# Patient Record
Sex: Female | Born: 1975
Health system: Southern US, Community
[De-identification: ages and names within clinical notes are randomized; demographics above are authoritative.]

## PROBLEM LIST (undated history)

## (undated) DIAGNOSIS — E559 Vitamin D deficiency, unspecified: Secondary | ICD-10-CM

## (undated) DIAGNOSIS — D219 Benign neoplasm of connective and other soft tissue, unspecified: Secondary | ICD-10-CM

## (undated) DIAGNOSIS — R002 Palpitations: Secondary | ICD-10-CM

## (undated) DIAGNOSIS — R519 Headache, unspecified: Secondary | ICD-10-CM

## (undated) DIAGNOSIS — R0602 Shortness of breath: Secondary | ICD-10-CM

## (undated) DIAGNOSIS — M2550 Pain in unspecified joint: Secondary | ICD-10-CM

## (undated) DIAGNOSIS — R87619 Unspecified abnormal cytological findings in specimens from cervix uteri: Secondary | ICD-10-CM

## (undated) DIAGNOSIS — M7989 Other specified soft tissue disorders: Secondary | ICD-10-CM

## (undated) DIAGNOSIS — F900 Attention-deficit hyperactivity disorder, predominantly inattentive type: Secondary | ICD-10-CM

## (undated) DIAGNOSIS — E538 Deficiency of other specified B group vitamins: Secondary | ICD-10-CM

## (undated) DIAGNOSIS — K5900 Constipation, unspecified: Secondary | ICD-10-CM

## (undated) DIAGNOSIS — J45909 Unspecified asthma, uncomplicated: Secondary | ICD-10-CM

## (undated) DIAGNOSIS — N920 Excessive and frequent menstruation with regular cycle: Secondary | ICD-10-CM

## (undated) DIAGNOSIS — N946 Dysmenorrhea, unspecified: Secondary | ICD-10-CM

## (undated) DIAGNOSIS — M549 Dorsalgia, unspecified: Secondary | ICD-10-CM

## (undated) DIAGNOSIS — R12 Heartburn: Secondary | ICD-10-CM

## (undated) DIAGNOSIS — D573 Sickle-cell trait: Secondary | ICD-10-CM

## (undated) HISTORY — DX: Deficiency of other specified B group vitamins: E53.8

## (undated) HISTORY — DX: Headache, unspecified: R51.9

## (undated) HISTORY — DX: Benign neoplasm of connective and other soft tissue, unspecified: D21.9

## (undated) HISTORY — DX: Palpitations: R00.2

## (undated) HISTORY — DX: Shortness of breath: R06.02

## (undated) HISTORY — DX: Excessive and frequent menstruation with regular cycle: N92.0

## (undated) HISTORY — DX: Vitamin D deficiency, unspecified: E55.9

## (undated) HISTORY — DX: Pain in unspecified joint: M25.50

## (undated) HISTORY — DX: Attention-deficit hyperactivity disorder, predominantly inattentive type: F90.0

## (undated) HISTORY — DX: Unspecified abnormal cytological findings in specimens from cervix uteri: R87.619

## (undated) HISTORY — DX: Other specified soft tissue disorders: M79.89

## (undated) HISTORY — DX: Dorsalgia, unspecified: M54.9

## (undated) HISTORY — PX: ABDOMINAL HYSTERECTOMY: SHX81

## (undated) HISTORY — DX: Unspecified asthma, uncomplicated: J45.909

## (undated) HISTORY — DX: Dysmenorrhea, unspecified: N94.6

## (undated) HISTORY — DX: Constipation, unspecified: K59.00

## (undated) HISTORY — DX: Sickle-cell trait: D57.3

## (undated) HISTORY — DX: Heartburn: R12

---

## 2005-11-30 HISTORY — PX: ABDOMINAL HYSTERECTOMY: SHX81

## 2017-11-13 ENCOUNTER — Emergency Department (HOSPITAL_COMMUNITY)
Admission: EM | Admit: 2017-11-13 | Discharge: 2017-11-13 | Disposition: A | Discharge status: Home / Self Care | Payer: 59 | Attending: Emergency Medicine | Admitting: Emergency Medicine

## 2017-11-13 ENCOUNTER — Emergency Department (HOSPITAL_COMMUNITY): Payer: 59

## 2017-11-13 ENCOUNTER — Encounter (HOSPITAL_COMMUNITY): Payer: Self-pay

## 2017-11-13 DIAGNOSIS — M25511 Pain in right shoulder: Secondary | ICD-10-CM | POA: Diagnosis not present

## 2017-11-13 NOTE — ED Triage Notes (Signed)
Pt complains of right arm pain since after Thanksgiving and now the pain is in her shoulder, no injury noted Pt states that she doesn't have full extension of that arm

## 2017-11-13 NOTE — Discharge Instructions (Signed)
1. Medications: Alternate 600 mg of ibuprofen and [phone] mg of Tylenol every 3 hours as needed for pain for the next 3-5 days. Do not exceed 4000 mg of Tylenol daily. 2. Treatment: rest, ice, elevate at rest, drink plenty of fluids, gentle stretching (see attached exercises) 3. Follow Up: Please followup with orthopedics as directed in 1 week if no improvement for discussion of your diagnoses and further evaluation after today's visit; Please return to the ER for worsening symptoms or other concerns such as fever, weakness, shortness of breath, or chest pain

## 2017-11-13 NOTE — ED Provider Notes (Signed)
[facility] DEPT Provider Note   CSN: [id number] Arrival date & time: 11/13/17  1920     History   Chief Complaint No chief complaint on file.   HPI Norma Soto is a 41 y.o. female with no significant past medical history who presents today with chief complaint acute onset, progressively worsening right upper arm and shoulder pain for 3 weeks.  Patient states that she began developing a constant achy pain to the anterior right upper arm, primarily in the deltoid region which has since radiated to the right shoulder.  She also endorses recent development of intermittent tingling of the digits of the right hand.  Pain worsens with certain movements, primarily upward motion and abduction of the right shoulder.  She denies weakness.  Denies fevers, chills, chest pain, neck pain, shortness of breath.  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  She has used a TENS unit as well as without significant relief over-the-counter Tylenol arthritis with mild relief.  The history is provided by the patient.    History reviewed. No pertinent past medical history.  There are no active problems to display for this patient.   History reviewed. No pertinent surgical history.  OB History    No data available       Home Medications    Prior to Admission medications   Not on File    Family History History reviewed. No pertinent family history.  Social History Social History   Tobacco Use  . Smoking status: Never Smoker  . Smokeless tobacco: Never Used  Substance Use Topics  . Alcohol use: No    Frequency: Never  . Drug use: No     Allergies   Patient has no allergy information on record.   Review of Systems Review of Systems  Constitutional: Negative for chills and fever.  Respiratory: Negative for shortness of breath.   Cardiovascular: Negative for chest pain.    Musculoskeletal: Positive for arthralgias (Right shoulder). Negative for neck pain.  Neurological: Negative for weakness and numbness.     Physical Exam Updated Vital Signs BP (!) 129/91 (BP Location: Left Arm)   Pulse 80   Temp 98.5 F (36.9 C) (Oral)   Resp 18   SpO2 99%   Physical Exam  Constitutional: She appears well-developed and well-nourished. No distress.  HENT:  Head: Normocephalic and atraumatic.  Eyes: Conjunctivae are normal. Right eye exhibits no discharge. Left eye exhibits no discharge.  Neck: Normal range of motion. Neck supple. No JVD present. No tracheal deviation present.  No midline spine TTP, right paracervical muscle spasm noted without significant tenderness, no deformity, crepitus, or step-off noted   Cardiovascular: Normal rate and intact distal pulses.  2+ radial pulses bilaterally  Pulmonary/Chest: Effort normal.  Abdominal: She exhibits no distension.  Musculoskeletal: She exhibits tenderness. She exhibits no edema.  Slightly decreased range of motion with abduction and upward motion of the right shoulder secondary to pain .  Positive arc of pain.  She is tender to palpation overlying the right acromioclavicular joint.  No tenderness to palpation of the bicipital tendon groove.  Positive empty can sign on the right, positive Hawkins sign on the right.  5/5 strength of BUE major muscle groups.  No deformity, crepitus, erythema, or warmth noted.  Neurological: She is alert.  Fluent speech, no facial droop, sensation intact to soft touch of bilateral upper extremities, good grip  strength  Skin: Skin is warm and dry. Capillary refill takes less than 2 seconds. No erythema.  Psychiatric: She has a normal mood and affect. Her behavior is normal.  Nursing note and vitals reviewed.    ED Treatments / Results  Labs (all labs ordered are listed, but only abnormal results are displayed) Labs Reviewed - No data to display  EKG  EKG Interpretation None        Radiology Dg Shoulder Right  Result Date: 11/13/2017 CLINICAL DATA:  Right shoulder pain EXAM: RIGHT SHOULDER - 2+ VIEW COMPARISON:  None. FINDINGS: There is no evidence of fracture or dislocation. There is no evidence of arthropathy or other focal bone abnormality. Soft tissues are unremarkable. IMPRESSION: Negative. Electronically Signed   By: Kerby Moors M.D.   On: 11/13/2017 19:56    Procedures Procedures (including critical care time)  Medications Ordered in ED Medications - No data to display   Initial Impression / Assessment and Plan / ED Course  I have reviewed the triage vital signs and the nursing notes.  Pertinent labs & imaging results that were available during my care of the patient were reviewed by me and considered in my medical decision making (see chart for details).     Patient with right shoulder pain for 3 weeks.  History and physical examination consistent with rotator cuff injury or inflammation.  Afebrile, vital signs are stable.  No focal neurological deficits.  Does not appear to be a cervical radiculopathy.  Low suspicion of septic joint, gout, osteomyelitis, fracture, or dislocation.  Radiographs reviewed by me show no acute bony abnormality.  RICE therapy indicated and discussed with patient.  Discussed the importance of ibuprofen, Tylenol, ice, and heat.  Will discharge with range of motion exercises.  She will follow-up with orthopedics for reevaluation of her symptoms.  Discussed indications for return to the ED. Pt verbalized understanding of and agreement with plan and is safe for discharge home at this time.  She has no complaints prior to discharge.  Final Clinical Impressions(s) / ED Diagnoses   Final diagnoses:  Acute pain of right shoulder    ED Discharge Orders    None       Debroah Baller 11/13/17 2020    Charlesetta Shanks, MD 11/15/17 [phone]

## 2018-01-04 ENCOUNTER — Emergency Department (HOSPITAL_COMMUNITY)
Admission: EM | Admit: 2018-01-04 | Discharge: 2018-01-04 | Disposition: A | Discharge status: Home / Self Care | Payer: BLUE CROSS/BLUE SHIELD | Attending: Emergency Medicine | Admitting: Emergency Medicine

## 2018-01-04 ENCOUNTER — Encounter (HOSPITAL_COMMUNITY): Payer: Self-pay | Admitting: Emergency Medicine

## 2018-01-04 DIAGNOSIS — B9789 Other viral agents as the cause of diseases classified elsewhere: Secondary | ICD-10-CM

## 2018-01-04 DIAGNOSIS — J069 Acute upper respiratory infection, unspecified: Secondary | ICD-10-CM

## 2018-01-04 DIAGNOSIS — R05 Cough: Secondary | ICD-10-CM | POA: Diagnosis not present

## 2018-01-04 MED ORDER — HYDROCODONE-HOMATROPINE 5-1.5 MG/5ML PO SYRP: 5.0000 mL | ORAL_SOLUTION | Freq: Four times a day (QID) | ORAL | 0 refills | Status: DC | PRN

## 2018-01-04 MED ORDER — ACETAMINOPHEN 325 MG PO TABS
650.0000 mg | ORAL_TABLET | Freq: Once | ORAL | Status: AC | PRN
  Administered 2018-01-04: 650 mg via ORAL
  Filled 2018-01-04: qty 2

## 2018-01-04 NOTE — Discharge Instructions (Signed)
It was my pleasure taking care of you today!   Your symptoms are likely due to a viral upper respiratory infection. Fortunately, we did not see evidence of serious infection and can treat your symptoms. Cough syrup at night - This can make you very drowsy - please do not drink alcohol, operate heavy machinery or drive on this medication. Alternate between Tylenol and ibuprofen as needed for body aches / fevers.   Rest, drink plenty of fluids to be sure you are staying hydrated.   Please follow up with your primary doctor for discussion of your diagnoses and further evaluation after today's visit if symptoms persist longer than 7 days; Return to the ER for high fevers, difficulty breathing or other concerning symptoms

## 2018-01-04 NOTE — ED Provider Notes (Signed)
[facility] DEPT Provider Note   CSN: [id number] Arrival date & time: 01/04/18  [place]     History   Chief Complaint Chief Complaint  Patient presents with  . Generalized Body Aches    HPI Norma Soto is a 42 y.o. female.  The history is provided by the patient and medical records. No language interpreter was used.   Norma Soto is an otherwise healthy 42 y.o. female who presents to the Emergency Department complaining of persistent dry cough which began yesterday.  Associated symptoms include rhinorrhea.  She has been taking Delsym with no improvement.  Denies fever, chills, shortness of breath, chest pain, abdominal pain, nausea, vomiting, diarrhea, sore throat.  Not a smoker.  No history of lung disease.    History reviewed. No pertinent past medical history.  There are no active problems to display for this patient.   Past Surgical History:  Procedure Laterality Date  . ABDOMINAL HYSTERECTOMY      OB History    No data available       Home Medications    Prior to Admission medications   Medication Sig Start Date End Date Taking? Authorizing Provider  HYDROcodone-homatropine (HYCODAN) 5-1.5 MG/5ML syrup Take 5 mLs by mouth every 6 (six) hours as needed for cough. 01/04/18   Nicolasa Milbrath, Ozella Almond, PA-C    Family History No family history on file.  Social History Social History   Tobacco Use  . Smoking status: Never Smoker  . Smokeless tobacco: Never Used  Substance Use Topics  . Alcohol use: No    Frequency: Never  . Drug use: No     Allergies   Patient has no allergy information on record.   Review of Systems Review of Systems  Constitutional: Negative for chills and fever.  HENT: Positive for rhinorrhea. Negative for ear pain, sinus pain and sore throat.   Respiratory: Positive for cough. Negative for shortness of breath and wheezing.   Cardiovascular: Negative for chest pain.     Physical Exam Updated  Vital Signs BP (!) 140/93 (BP Location: Left Arm)   Pulse 89   Temp 99.4 F (37.4 C) (Oral)   Resp 16   SpO2 98%   Physical Exam  Constitutional: She appears well-developed and well-nourished. No distress.  Well-appearing.  HENT:  Head: Normocephalic and atraumatic.  Oropharynx with scant erythema.  No exudates or tonsillar hypertrophy.  No focal areas of sinus tenderness.  Neck: Neck supple.  Cardiovascular: Normal rate, regular rhythm and normal heart sounds.  No murmur heard. Pulmonary/Chest: Effort normal and breath sounds normal. No respiratory distress.  Lungs clear to auscultation bilaterally.  Musculoskeletal: Normal range of motion.  Neurological: She is alert.  Skin: Skin is warm and dry.  Nursing note and vitals reviewed.    ED Treatments / Results  Labs (all labs ordered are listed, but only abnormal results are displayed) Labs Reviewed - No data to display  EKG  EKG Interpretation None       Radiology No results found.  Procedures Procedures (including critical care time)  Medications Ordered in ED Medications  acetaminophen (TYLENOL) tablet 650 mg (650 mg Oral Given 01/04/18 1907)     Initial Impression / Assessment and Plan / ED Course  I have reviewed the triage vital signs and the nursing notes.  Pertinent labs & imaging results that were available during my care of the patient were reviewed by me and considered in my medical decision making (see chart for details).  Norma Soto is a 42 y.o. female who presents to ED for dry cough. Persistent dry coughing fits throughout examination. She is afebrile, non-toxic appearing with a clear lung exam. Mild rhinorrhea and OP with erythema but no exudates or tonsillar hypertrophy.  Sxs today likely due to viral URI.Symptomatic home care instructions discussed. PCP follow up strongly encouraged if symptoms persist. Reasons to return to ER discussed. All questions answered.   Blood pressure (!)  140/93, pulse 89, temperature 99.4 F (37.4 C), temperature source Oral, resp. rate 16, SpO2 98 %.   Final Clinical Impressions(s) / ED Diagnoses   Final diagnoses:  Viral URI with cough    ED Discharge Orders        Ordered    HYDROcodone-homatropine (HYCODAN) 5-1.5 MG/5ML syrup  Every 6 hours PRN     01/04/18 2058       Askia Hazelip, Ozella Almond, PA-C 01/04/18 2104    Orlie Dakin, MD 01/05/18 4037

## 2018-01-04 NOTE — ED Triage Notes (Signed)
Patient reports nasal congestion starting 3 days ago now having sore throat, headache and generalized body aches. Non productive dry cough. Speaking in full sentences in NAD.

## 2018-01-06 DIAGNOSIS — R05 Cough: Secondary | ICD-10-CM | POA: Diagnosis not present

## 2018-01-06 DIAGNOSIS — R0982 Postnasal drip: Secondary | ICD-10-CM | POA: Diagnosis not present

## 2018-01-06 DIAGNOSIS — R0989 Other specified symptoms and signs involving the circulatory and respiratory systems: Secondary | ICD-10-CM | POA: Diagnosis not present

## 2018-01-06 DIAGNOSIS — J111 Influenza due to unidentified influenza virus with other respiratory manifestations: Secondary | ICD-10-CM | POA: Diagnosis not present

## 2018-03-25 ENCOUNTER — Encounter: Payer: Self-pay | Admitting: Certified Nurse Midwife

## 2018-03-25 ENCOUNTER — Ambulatory Visit (INDEPENDENT_AMBULATORY_CARE_PROVIDER_SITE_OTHER): Payer: BLUE CROSS/BLUE SHIELD | Admitting: Certified Nurse Midwife

## 2018-03-25 ENCOUNTER — Other Ambulatory Visit: Payer: Self-pay

## 2018-03-25 VITALS — BP 120/80 | HR 70 | Resp 16 | Ht 61.75 in | Wt 185.0 lb

## 2018-03-25 DIAGNOSIS — Z113 Encounter for screening for infections with a predominantly sexual mode of transmission: Secondary | ICD-10-CM | POA: Diagnosis not present

## 2018-03-25 DIAGNOSIS — Z01419 Encounter for gynecological examination (general) (routine) without abnormal findings: Secondary | ICD-10-CM | POA: Diagnosis not present

## 2018-03-25 DIAGNOSIS — Z0000 Encounter for general adult medical examination without abnormal findings: Secondary | ICD-10-CM

## 2018-03-25 NOTE — Progress Notes (Signed)
42 y.o. G1L1 Single  African American Fe here to establish gyn care and  for annual exam. History of Hysterectomy for fibroid and bleeding. One abnormal pap smear with just repeat and negative. Sees [facility] now after moving to area  for flu 2/19 and treated.  Has new partner and desires  STD screening with screening labs if possible.working on eating better with good food choices now for weight loss. No other health issues today.  No LMP recorded. Patient has had a hysterectomy.          Sexually active: Yes.    The current method of family planning is status post hysterectomy.    Exercising: Yes.    cardio Smoker:  no  Health Maintenance: Pap:  10/2015 neg per patient History of Abnormal Pap: yrs ago, just repeat done MMG:  none Self Breast exams: occ Colonoscopy:  none BMD:   none TDaP:  More than 10 years Shingles: no Pneumonia: no Hep C and HIV: both neg per patient Labs: if needed.   reports that she has never smoked. She has never used smokeless tobacco. She reports that she does not drink alcohol or use drugs.  Past Medical History:  Diagnosis Date  . Abnormal Pap smear of cervix    many yrs ago, just repeat done  . Dysmenorrhea   . Fibroid   . Menorrhagia     Past Surgical History:  Procedure Laterality Date  . ABDOMINAL HYSTERECTOMY    . CESAREAN SECTION      No current outpatient medications on file.   No current facility-administered medications for this visit.     Family History  Problem Relation Age of Onset  . Multiple myeloma Mother   . Hypertension Father   . Stroke Father   . Heart attack Father   . Heart disease Father     ROS:  Pertinent items are noted in HPI.  Otherwise, a comprehensive ROS was negative.  Exam:   BP 120/80   Pulse 70   Resp 16   Ht 5' 1.75" (1.568 m)   Wt 185 lb (83.9 kg)   BMI 34.11 kg/m  Height: 5' 1.75" (156.8 cm) Ht Readings from Last 3 Encounters:  03/25/18 5' 1.75" (1.568 m)    General  appearance: alert, cooperative and appears stated age Head: Normocephalic, without obvious abnormality, atraumatic Neck: no adenopathy, supple, symmetrical, trachea midline and thyroid normal to inspection and palpation Lungs: clear to auscultation bilaterally Breasts: normal appearance, no masses or tenderness, No nipple retraction or dimpling, No nipple discharge or bleeding, No axillary or supraclavicular adenopathy, pendulous bilateral breasts Heart: regular rate and rhythm Abdomen: soft, non-tender; no masses,  no organomegaly Extremities: extremities normal, atraumatic, no cyanosis or edema Skin: Skin color, texture, turgor normal. No rashes or lesions Lymph nodes: Cervical, supraclavicular, and axillary nodes normal. No abnormal inguinal nodes palpated Neurologic: Grossly normal   Pelvic: External genitalia:  no lesions              Urethra:  normal appearing urethra with no masses, tenderness or lesions              Bartholin's and Skene's: normal                 Vagina: normal appearing vagina with normal color and discharge, no lesions              Cervix: absent  Pap taken: No. Bimanual Exam:  Uterus:  uterus absent              Adnexa: normal adnexa and no mass, fullness, tenderness               Rectovaginal: Confirms               Anus:  normal sphincter tone, no lesions  Chaperone present: yes  A:  Well Woman with normal exam  S/P TAH with ovaries retained for fibroid uterus and menorrhagia  Obesity on weight loss with change in diet  STD screening and screening labs requested  Mammogram screening due  P:   Reviewed health and wellness pertinent to exam  Discussed weight loss and exercise for improved health and prevention of other health concerns. Work on portion control and fresh colorful food with lean protein.  Discussed STD prevention.  Labs:STD panel, Hep C, Gc/Chlamydia, vaginal screen TSH, Lipid panel, Vitamin D, CBC  Given mammogram screening  information and recommended starting screening this year. Questions addressed.  Pap smear: no  counseled on breast self exam,  mammography screening, STD prevention, HIV risk factors and prevention, adequate intake of calcium and vitamin D, diet and exercise  return annually or prn  An After Visit Summary was printed and given to the patient.

## 2018-03-25 NOTE — Patient Instructions (Signed)

## 2018-03-26 LAB — HEPATITIS C ANTIBODY: Hep C Virus Ab: <0.1 s/co ratio (ref 0.0–0.9)

## 2018-03-26 LAB — VAGINITIS/VAGINOSIS, DNA PROBE
Candida Species: NEGATIVE
Gardnerella vaginalis: POSITIVE — AB
Trichomonas vaginosis: NEGATIVE

## 2018-03-26 LAB — LIPID PANEL
Chol/HDL Ratio: 3.4 ratio (ref 0.0–4.4)
Cholesterol, Total: 175 mg/dL (ref 100–199)
HDL: 52 mg/dL (ref >39)
LDL Calculated: 115 mg/dL — ABNORMAL HIGH (ref 0–99)
Triglycerides: 38 mg/dL (ref 0–149)
VLDL Cholesterol Cal: 8 mg/dL (ref 5–40)

## 2018-03-26 LAB — CBC
Hematocrit: 37.9 % (ref 34.0–46.6)
Hemoglobin: 12.3 g/dL (ref 11.1–15.9)
MCH: 27.5 pg (ref 26.6–33.0)
MCHC: 32.5 g/dL (ref 31.5–35.7)
MCV: 85 fL (ref 79–97)
Platelets: 196 10*3/uL (ref 150–379)
RBC: 4.47 x10E6/uL (ref 3.77–5.28)
RDW: 13.6 % (ref 12.3–15.4)
WBC: 3.6 10*3/uL (ref 3.4–10.8)

## 2018-03-26 LAB — VITAMIN D 25 HYDROXY (VIT D DEFICIENCY, FRACTURES): Vit D, 25-Hydroxy: 26.8 ng/mL — ABNORMAL LOW (ref 30.0–100.0)

## 2018-03-26 LAB — HEP, RPR, HIV PANEL
HIV Screen 4th Generation wRfx: NONREACTIVE
Hepatitis B Surface Ag: NEGATIVE
RPR Ser Ql: NONREACTIVE

## 2018-03-26 LAB — TSH: TSH: 1.17 u[IU]/mL (ref 0.450–4.500)

## 2018-03-26 LAB — GC/CHLAMYDIA PROBE AMP
Chlamydia trachomatis, NAA: NEGATIVE
Neisseria gonorrhoeae by PCR: NEGATIVE

## 2018-03-28 ENCOUNTER — Other Ambulatory Visit: Payer: Self-pay | Admitting: Certified Nurse Midwife

## 2018-03-28 DIAGNOSIS — E559 Vitamin D deficiency, unspecified: Secondary | ICD-10-CM

## 2018-03-29 ENCOUNTER — Other Ambulatory Visit: Payer: Self-pay

## 2018-03-29 MED ORDER — METRONIDAZOLE 500 MG PO TABS: 500.0000 mg | ORAL_TABLET | Freq: Two times a day (BID) | ORAL | 0 refills | Status: DC

## 2018-06-24 ENCOUNTER — Other Ambulatory Visit (INDEPENDENT_AMBULATORY_CARE_PROVIDER_SITE_OTHER): Payer: BLUE CROSS/BLUE SHIELD

## 2018-06-24 DIAGNOSIS — E559 Vitamin D deficiency, unspecified: Secondary | ICD-10-CM

## 2018-06-25 LAB — VITAMIN D 25 HYDROXY (VIT D DEFICIENCY, FRACTURES): Vit D, 25-Hydroxy: 48.4 ng/mL (ref 30.0–100.0)

## 2018-07-22 ENCOUNTER — Encounter: Payer: Self-pay | Admitting: Certified Nurse Midwife

## 2018-07-22 ENCOUNTER — Other Ambulatory Visit: Payer: Self-pay

## 2018-07-22 ENCOUNTER — Ambulatory Visit: Payer: BLUE CROSS/BLUE SHIELD | Admitting: Certified Nurse Midwife

## 2018-07-22 VITALS — BP 120/70 | HR 70 | Resp 16 | Ht 61.75 in | Wt 188.0 lb

## 2018-07-22 DIAGNOSIS — Z113 Encounter for screening for infections with a predominantly sexual mode of transmission: Secondary | ICD-10-CM

## 2018-07-22 DIAGNOSIS — Z01419 Encounter for gynecological examination (general) (routine) without abnormal findings: Secondary | ICD-10-CM

## 2018-07-22 NOTE — Patient Instructions (Signed)
Sexually Transmitted Disease  A sexually transmitted disease (STD) is a disease or infection that may be passed (transmitted) from person to person, usually during sexual activity. This may happen by way of saliva, semen, blood, vaginal mucus, or urine. Common STDs include:   Gonorrhea.   Chlamydia.   Syphilis.   HIV and AIDS.   Genital herpes.   Hepatitis B and C.   Trichomonas.   Human papillomavirus (HPV).   Pubic lice.   Scabies.   Mites.   Bacterial vaginosis.    What are the causes?  An STD may be caused by bacteria, a virus, or parasites. STDs are often transmitted during sexual activity if one person is infected. However, they may also be transmitted through nonsexual means. STDs may be transmitted after:   Sexual intercourse with an infected person.   Sharing sex toys with an infected person.   Sharing needles with an infected person or using unclean piercing or tattoo needles.   Having intimate contact with the genitals, mouth, or rectal areas of an infected person.   Exposure to infected fluids during birth.    What are the signs or symptoms?  Different STDs have different symptoms. Some people may not have any symptoms. If symptoms are present, they may include:   Painful or bloody urination.   Pain in the pelvis, abdomen, vagina, anus, throat, or eyes.   A skin rash, itching, or irritation.   Growths, ulcerations, blisters, or sores in the genital and anal areas.   Abnormal vaginal discharge with or without bad odor.   Penile discharge in men.   Fever.   Pain or bleeding during sexual intercourse.   Swollen glands in the groin area.   Yellow skin and eyes (jaundice). This is seen with hepatitis.   Swollen testicles.   Infertility.   Sores and blisters in the mouth.    How is this diagnosed?  To make a diagnosis, your health care provider may:   Take a medical history.   Perform a physical exam.   Take a sample of any discharge to examine.   Swab the throat, cervix,  opening to the penis, rectum, or vagina for testing.   Test a sample of your first morning urine.   Perform blood tests.   Perform a Pap test, if this applies.   Perform a colposcopy.   Perform a laparoscopy.    How is this treated?  Treatment depends on the STD. Some STDs may be treated but not cured.   Chlamydia, gonorrhea, trichomonas, and syphilis can be cured with antibiotic medicine.   Genital herpes, hepatitis, and HIV can be treated, but not cured, with prescribed medicines. The medicines lessen symptoms.   Genital warts from HPV can be treated with medicine or by freezing, burning (electrocautery), or surgery. Warts may come back.   HPV cannot be cured with medicine or surgery. However, abnormal areas may be removed from the cervix, vagina, or vulva.   If your diagnosis is confirmed, your recent sexual partners need treatment. This is true even if they are symptom-free or have a negative culture or evaluation. They should not have sex until their health care providers say it is okay.   Your health care provider may test you for infection again 3 months after treatment.    How is this prevented?  Take these steps to reduce your risk of getting an STD:   Use latex condoms, dental dams, and water-soluble lubricants during sexual activity. Do not use   petroleum jelly or oils.   Avoid having multiple sex partners.   Do not have sex with someone who has other sex partners.   Do not have sex with anyone you do not know or who is at high risk for an STD.   Avoid risky sex practices that can break your skin.   Do not have sex if you have open sores on your mouth or skin.   Avoid drinking too much alcohol or taking illegal drugs. Alcohol and drugs can affect your judgment and put you in a vulnerable position.   Avoid engaging in oral and anal sex acts.   Get vaccinated for HPV and hepatitis. If you have not received these vaccines in the past, talk to your health care provider about whether one or  both might be right for you.   If you are at risk of being infected with HIV, it is recommended that you take a prescription medicine daily to prevent HIV infection. This is called pre-exposure prophylaxis (PrEP). You are considered at risk if:  ? You are a man who has sex with other men (MSM).  ? You are a heterosexual man or woman and are sexually active with more than one partner.  ? You take drugs by injection.  ? You are sexually active with a partner who has HIV.   Talk with your health care provider about whether you are at high risk of being infected with HIV. If you choose to begin PrEP, you should first be tested for HIV. You should then be tested every 3 months for as long as you are taking PrEP.    Contact a health care provider if:   See your health care provider.   Tell your sexual partner(s). They should be tested and treated for any STDs.   Do not have sex until your health care provider says it is okay.  Get help right away if:  Contact your health care provider right away if:   You have severe abdominal pain.   You are a man and notice swelling or pain in your testicles.   You are a woman and notice swelling or pain in your vagina.    This information is not intended to replace advice given to you by your health care provider. Make sure you discuss any questions you have with your health care provider.  Document Released: 02/06/2003 Document Revised: 06/05/2016 Document Reviewed: 06/06/2013  Elsevier Interactive Patient Education  2018 [employer]

## 2018-07-22 NOTE — Progress Notes (Signed)
  Subjective:     Patient ID: Norma Soto, female   DOB: 12-03-1975, 42 y.o.   MRN: [id number]  42 yo g1p1 single female here for STD screening due to partner had a partner change. No vaginal issues or change in discharge or odor or pelvic pain. Periods none due to Hysterectomy. No other health issues today.    Review of Systems  Constitutional: Negative.   HENT: Negative.   Eyes: Negative.   Respiratory: Negative.   Cardiovascular: Negative.   Gastrointestinal: Negative.   Endocrine: Negative.   Genitourinary:       Vaginal itching  Musculoskeletal: Negative.   Skin: Negative.   Allergic/Immunologic: Negative.   Neurological: Negative.   Hematological: Negative.   Psychiatric/Behavioral: Negative.        Objective:   Physical Exam  Constitutional: She is oriented to person, place, and time. She appears well-developed and well-nourished.  Abdominal: Soft.  Genitourinary: Rectum normal. Pelvic exam was performed with patient supine. There is no rash, tenderness, lesion or injury on the right labia. There is no rash, tenderness, lesion or injury on the left labia. Right adnexum displays no mass, no tenderness and no fullness. Left adnexum displays no mass, no tenderness and no fullness. Vaginal discharge found.  Genitourinary Comments: Normal appearance Uterus and cervix surgically absent  Lymphadenopathy: No inguinal adenopathy noted on the right or left side.  Neurological: She is oriented to person, place, and time.  Skin: Skin is dry.  Psychiatric: She has a normal mood and affect. Her behavior is normal. Judgment and thought content normal.       Assessment:     Normal pelvic exam STD screening due to partner change    Plan:     Discussed normal pelvic exam finding. Labs: Affirm, GC/chlamydia, STD panel, Hep C Stressed condom use for protection  Rv prn

## 2018-07-23 LAB — VAGINITIS/VAGINOSIS, DNA PROBE
Candida Species: NEGATIVE
Gardnerella vaginalis: POSITIVE — AB
Trichomonas vaginosis: NEGATIVE

## 2018-07-23 LAB — HEPATITIS C ANTIBODY: Hep C Virus Ab: <0.1 s/co ratio (ref 0.0–0.9)

## 2018-07-23 LAB — HEP, RPR, HIV PANEL
HIV Screen 4th Generation wRfx: NONREACTIVE
Hepatitis B Surface Ag: NEGATIVE
RPR Ser Ql: NONREACTIVE

## 2018-07-24 ENCOUNTER — Other Ambulatory Visit: Payer: Self-pay | Admitting: Certified Nurse Midwife

## 2018-07-24 DIAGNOSIS — N760 Acute vaginitis: Principal | ICD-10-CM

## 2018-07-24 DIAGNOSIS — B9689 Other specified bacterial agents as the cause of diseases classified elsewhere: Secondary | ICD-10-CM

## 2018-07-24 MED ORDER — METRONIDAZOLE 0.75 % VA GEL: VAGINAL | 0 refills | Status: DC

## 2018-07-27 LAB — GC/CHLAMYDIA PROBE AMP
Chlamydia trachomatis, NAA: NEGATIVE
Neisseria gonorrhoeae by PCR: NEGATIVE

## 2018-09-14 ENCOUNTER — Other Ambulatory Visit: Payer: Self-pay | Admitting: Certified Nurse Midwife

## 2018-09-14 DIAGNOSIS — Z1231 Encounter for screening mammogram for malignant neoplasm of breast: Secondary | ICD-10-CM

## 2018-10-21 ENCOUNTER — Ambulatory Visit
Admission: RE | Admit: 2018-10-21 | Discharge: 2018-10-21 | Disposition: A | Discharge status: Home / Self Care | Payer: BLUE CROSS/BLUE SHIELD | Source: Ambulatory Visit | Attending: Certified Nurse Midwife | Admitting: Certified Nurse Midwife

## 2018-10-21 DIAGNOSIS — Z1231 Encounter for screening mammogram for malignant neoplasm of breast: Secondary | ICD-10-CM | POA: Diagnosis not present

## 2018-10-25 ENCOUNTER — Other Ambulatory Visit: Payer: Self-pay | Admitting: Certified Nurse Midwife

## 2018-10-25 DIAGNOSIS — R928 Other abnormal and inconclusive findings on diagnostic imaging of breast: Secondary | ICD-10-CM

## 2018-10-30 HISTORY — PX: BREAST BIOPSY: SHX20

## 2018-11-01 ENCOUNTER — Ambulatory Visit
Admission: RE | Admit: 2018-11-01 | Discharge: 2018-11-01 | Disposition: A | Discharge status: Home / Self Care | Payer: BLUE CROSS/BLUE SHIELD | Source: Ambulatory Visit | Attending: Certified Nurse Midwife | Admitting: Certified Nurse Midwife

## 2018-11-01 ENCOUNTER — Other Ambulatory Visit: Payer: Self-pay | Admitting: Certified Nurse Midwife

## 2018-11-01 DIAGNOSIS — R928 Other abnormal and inconclusive findings on diagnostic imaging of breast: Secondary | ICD-10-CM | POA: Diagnosis not present

## 2018-11-01 DIAGNOSIS — R599 Enlarged lymph nodes, unspecified: Secondary | ICD-10-CM

## 2018-11-01 DIAGNOSIS — N6313 Unspecified lump in the right breast, lower outer quadrant: Secondary | ICD-10-CM | POA: Diagnosis not present

## 2018-11-01 DIAGNOSIS — N6310 Unspecified lump in the right breast, unspecified quadrant: Secondary | ICD-10-CM

## 2018-11-01 DIAGNOSIS — N6311 Unspecified lump in the right breast, upper outer quadrant: Secondary | ICD-10-CM | POA: Diagnosis not present

## 2018-11-07 ENCOUNTER — Other Ambulatory Visit: Payer: Self-pay | Admitting: Certified Nurse Midwife

## 2018-11-07 ENCOUNTER — Ambulatory Visit
Admission: RE | Admit: 2018-11-07 | Discharge: 2018-11-07 | Disposition: A | Discharge status: Home / Self Care | Payer: BLUE CROSS/BLUE SHIELD | Source: Ambulatory Visit | Attending: Certified Nurse Midwife | Admitting: Certified Nurse Midwife

## 2018-11-07 DIAGNOSIS — N6310 Unspecified lump in the right breast, unspecified quadrant: Secondary | ICD-10-CM

## 2018-11-07 DIAGNOSIS — N6315 Unspecified lump in the right breast, overlapping quadrants: Secondary | ICD-10-CM | POA: Diagnosis not present

## 2018-11-07 DIAGNOSIS — R590 Localized enlarged lymph nodes: Secondary | ICD-10-CM | POA: Diagnosis not present

## 2018-11-07 DIAGNOSIS — R599 Enlarged lymph nodes, unspecified: Secondary | ICD-10-CM

## 2019-03-28 ENCOUNTER — Ambulatory Visit: Payer: BLUE CROSS/BLUE SHIELD | Admitting: Certified Nurse Midwife

## 2019-05-18 ENCOUNTER — Other Ambulatory Visit: Payer: Self-pay

## 2019-05-18 ENCOUNTER — Other Ambulatory Visit (INDEPENDENT_AMBULATORY_CARE_PROVIDER_SITE_OTHER): Payer: Self-pay

## 2019-05-18 ENCOUNTER — Other Ambulatory Visit: Payer: Self-pay | Admitting: Neurology

## 2019-05-18 DIAGNOSIS — T733XXA Exhaustion due to excessive exertion, initial encounter: Secondary | ICD-10-CM

## 2019-05-18 DIAGNOSIS — Z131 Encounter for screening for diabetes mellitus: Secondary | ICD-10-CM

## 2019-05-18 DIAGNOSIS — R5383 Other fatigue: Secondary | ICD-10-CM

## 2019-05-18 DIAGNOSIS — D709 Neutropenia, unspecified: Secondary | ICD-10-CM

## 2019-05-18 DIAGNOSIS — Z0289 Encounter for other administrative examinations: Secondary | ICD-10-CM

## 2019-05-19 ENCOUNTER — Telehealth: Payer: Self-pay | Admitting: Neurology

## 2019-05-19 ENCOUNTER — Other Ambulatory Visit: Payer: Self-pay | Admitting: Neurology

## 2019-05-19 DIAGNOSIS — D709 Neutropenia, unspecified: Secondary | ICD-10-CM

## 2019-05-19 LAB — CBC WITH DIFFERENTIAL/PLATELET
Basophils Absolute: 0 10*3/uL (ref 0.0–0.2)
Basos: 1 %
EOS (ABSOLUTE): 0.1 10*3/uL (ref 0.0–0.4)
Eos: 3 %
Hematocrit: 41.1 % (ref 34.0–46.6)
Hemoglobin: 13.4 g/dL (ref 11.1–15.9)
Immature Grans (Abs): 0 10*3/uL (ref 0.0–0.1)
Immature Granulocytes: 0 %
Lymphocytes Absolute: 1.4 10*3/uL (ref 0.7–3.1)
Lymphs: 46 %
MCH: 27.6 pg (ref 26.6–33.0)
MCHC: 32.6 g/dL (ref 31.5–35.7)
MCV: 85 fL (ref 79–97)
Monocytes Absolute: 0.2 10*3/uL (ref 0.1–0.9)
Monocytes: 8 %
Neutrophils Absolute: 1.3 10*3/uL — ABNORMAL LOW (ref 1.4–7.0)
Neutrophils: 42 %
Platelets: 211 10*3/uL (ref 150–450)
RBC: 4.86 x10E6/uL (ref 3.77–5.28)
RDW: 12.6 % (ref 11.7–15.4)
WBC: 3 10*3/uL — ABNORMAL LOW (ref 3.4–10.8)

## 2019-05-19 LAB — COMPREHENSIVE METABOLIC PANEL
ALT: 14 IU/L (ref 0–32)
AST: 10 IU/L (ref 0–40)
Albumin/Globulin Ratio: 1.4 (ref 1.2–2.2)
Albumin: 4.3 g/dL (ref 3.8–4.8)
Alkaline Phosphatase: 51 IU/L (ref 39–117)
BUN/Creatinine Ratio: 16 (ref 9–23)
BUN: 12 mg/dL (ref 6–24)
Bilirubin Total: 0.4 mg/dL (ref 0.0–1.2)
CO2: 23 mmol/L (ref 20–29)
Calcium: 9.6 mg/dL (ref 8.7–10.2)
Chloride: 107 mmol/L — ABNORMAL HIGH (ref 96–106)
Creatinine, Ser: 0.74 mg/dL (ref 0.57–1.00)
GFR calc Af Amer: 116 mL/min/{1.73_m2} (ref >59)
GFR calc non Af Amer: 100 mL/min/{1.73_m2} (ref >59)
Globulin, Total: 3.1 g/dL (ref 1.5–4.5)
Glucose: 86 mg/dL (ref 65–99)
Potassium: 4 mmol/L (ref 3.5–5.2)
Sodium: 142 mmol/L (ref 134–144)
Total Protein: 7.4 g/dL (ref 6.0–8.5)

## 2019-05-19 LAB — TSH: TSH: 2.23 u[IU]/mL (ref 0.450–4.500)

## 2019-05-19 LAB — HEMOGLOBIN A1C
Est. average glucose Bld gHb Est-mCnc: 111 mg/dL
Hgb A1c MFr Bld: 5.5 % (ref 4.8–5.6)

## 2019-05-19 LAB — LIPID PANEL
Chol/HDL Ratio: 3.5 ratio (ref 0.0–4.4)
Cholesterol, Total: 197 mg/dL (ref 100–199)
HDL: 57 mg/dL (ref >39)
LDL Calculated: 133 mg/dL — ABNORMAL HIGH (ref 0–99)
Triglycerides: 36 mg/dL (ref 0–149)
VLDL Cholesterol Cal: 7 mg/dL (ref 5–40)

## 2019-05-19 NOTE — Telephone Encounter (Signed)
I called and left a message for patient.  Asked her to give me a call back with any questions or I can discuss this with her Monday.  Lipid panel showed slightly elevated LDL at 133 recommend lifestyle changes and diet and possibly a statin, she should follow-up with her primary care on this.  Hemoglobin A1c normal.  Thyroid normal.  CMP unremarkable.  CBC showed some mild leukopenia with neutropenia.  We will repeat CBC with differential on Monday and if consistent patient needs to follow-up with primary care for further evaluation.

## 2019-05-22 DIAGNOSIS — D709 Neutropenia, unspecified: Secondary | ICD-10-CM | POA: Diagnosis not present

## 2019-05-23 LAB — CBC WITH DIFFERENTIAL/PLATELET
Basophils Absolute: 0 10*3/uL (ref 0.0–0.2)
Basos: 1 %
EOS (ABSOLUTE): 0.1 10*3/uL (ref 0.0–0.4)
Eos: 3 %
Hematocrit: 37.7 % (ref 34.0–46.6)
Hemoglobin: 12.5 g/dL (ref 11.1–15.9)
Immature Grans (Abs): 0 10*3/uL (ref 0.0–0.1)
Immature Granulocytes: 0 %
Lymphocytes Absolute: 1.5 10*3/uL (ref 0.7–3.1)
Lymphs: 42 %
MCH: 27.9 pg (ref 26.6–33.0)
MCHC: 33.2 g/dL (ref 31.5–35.7)
MCV: 84 fL (ref 79–97)
Monocytes Absolute: 0.3 10*3/uL (ref 0.1–0.9)
Monocytes: 9 %
Neutrophils Absolute: 1.6 10*3/uL (ref 1.4–7.0)
Neutrophils: 45 %
Platelets: 187 10*3/uL (ref 150–450)
RBC: 4.48 x10E6/uL (ref 3.77–5.28)
RDW: 12.9 % (ref 11.7–15.4)
WBC: 3.6 10*3/uL (ref 3.4–10.8)

## 2019-06-09 ENCOUNTER — Ambulatory Visit (INDEPENDENT_AMBULATORY_CARE_PROVIDER_SITE_OTHER): Payer: BC Managed Care – PPO | Admitting: Certified Nurse Midwife

## 2019-06-09 ENCOUNTER — Ambulatory Visit: Payer: BLUE CROSS/BLUE SHIELD | Admitting: Certified Nurse Midwife

## 2019-06-09 ENCOUNTER — Other Ambulatory Visit: Payer: Self-pay

## 2019-06-09 ENCOUNTER — Encounter: Payer: Self-pay | Admitting: Certified Nurse Midwife

## 2019-06-09 VITALS — BP 110/70 | HR 70 | Temp 97.7°F | Resp 16 | Ht 61.75 in | Wt 195.0 lb

## 2019-06-09 DIAGNOSIS — Z01419 Encounter for gynecological examination (general) (routine) without abnormal findings: Secondary | ICD-10-CM

## 2019-06-09 DIAGNOSIS — Z113 Encounter for screening for infections with a predominantly sexual mode of transmission: Secondary | ICD-10-CM
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
## 2019-06-10 LAB — HIV ANTIBODY (ROUTINE TESTING W REFLEX): HIV Screen 4th Generation wRfx: NONREACTIVE

## 2019-06-10 LAB — VAGINITIS/VAGINOSIS, DNA PROBE
Candida Species: NEGATIVE
Gardnerella vaginalis: POSITIVE — AB
Trichomonas vaginosis: NEGATIVE

## 2019-06-10 LAB — RPR: RPR Ser Ql: NONREACTIVE

## 2019-06-10 LAB — HEPATITIS C ANTIBODY: Hep C Virus Ab: <0.1 s/co ratio (ref 0.0–0.9)

## 2019-06-11 ENCOUNTER — Other Ambulatory Visit: Payer: Self-pay | Admitting: Certified Nurse Midwife

## 2019-06-11 DIAGNOSIS — B9689 Other specified bacterial agents as the cause of diseases classified elsewhere: Secondary | ICD-10-CM

## 2019-06-11 DIAGNOSIS — N760 Acute vaginitis: Secondary | ICD-10-CM

## 2019-06-11 MED ORDER — METRONIDAZOLE 0.75 % VA GEL: VAGINAL | 0 refills | Status: DC

## 2019-06-12 LAB — GC/CHLAMYDIA PROBE AMP
Chlamydia trachomatis, NAA: NEGATIVE
Neisseria Gonorrhoeae by PCR: NEGATIVE

## 2019-06-29 ENCOUNTER — Ambulatory Visit: Payer: BC Managed Care – PPO | Admitting: Neurology

## 2019-06-29 ENCOUNTER — Telehealth: Payer: Self-pay | Admitting: Neurology

## 2019-06-29 ENCOUNTER — Other Ambulatory Visit: Payer: Self-pay

## 2019-06-29 ENCOUNTER — Encounter: Payer: Self-pay | Admitting: Neurology

## 2019-06-29 VITALS — BP 127/84 | HR 67 | Temp 99.1°F | Ht 61.0 in | Wt 199.0 lb

## 2019-06-29 DIAGNOSIS — F0781 Postconcussional syndrome: Secondary | ICD-10-CM | POA: Diagnosis not present

## 2019-06-29 DIAGNOSIS — S0990XA Unspecified injury of head, initial encounter: Secondary | ICD-10-CM

## 2019-06-29 DIAGNOSIS — G44309 Post-traumatic headache, unspecified, not intractable: Secondary | ICD-10-CM | POA: Diagnosis not present

## 2019-06-29 DIAGNOSIS — R42 Dizziness and giddiness: Secondary | ICD-10-CM | POA: Diagnosis not present

## 2019-06-29 DIAGNOSIS — S1980XA Other specified injuries of unspecified part of neck, initial encounter: Secondary | ICD-10-CM

## 2019-06-29 DIAGNOSIS — Z1152 Encounter for screening for COVID-19: Secondary | ICD-10-CM

## 2019-06-29 DIAGNOSIS — S134XXA Sprain of ligaments of cervical spine, initial encounter: Secondary | ICD-10-CM

## 2019-06-29 DIAGNOSIS — Z119 Encounter for screening for infectious and parasitic diseases, unspecified: Secondary | ICD-10-CM

## 2019-06-29 MED ORDER — LIDOCAINE 5 % EX PTCH: 1.0000 | MEDICATED_PATCH | CUTANEOUS | 4 refills | Status: DC

## 2019-06-29 MED ORDER — PROPRANOLOL HCL 10 MG PO TABS: 10.0000 mg | ORAL_TABLET | Freq: Three times a day (TID) | ORAL | 2 refills | Status: DC | PRN

## 2019-06-29 MED ORDER — AMITRIPTYLINE HCL 10 MG PO TABS: 10.0000 mg | ORAL_TABLET | Freq: Every day | ORAL | 6 refills | Status: DC

## 2019-06-29 MED ORDER — METHYLPREDNISOLONE 4 MG PO TBPK: ORAL_TABLET | ORAL | 1 refills | Status: DC

## 2019-06-29 NOTE — Telephone Encounter (Signed)
[employer] [place] Auth: NPR Ref # [id number] S at 3:03 easter time order sent to GI. They will reach out to the patient to schedule.

## 2019-06-29 NOTE — Patient Instructions (Addendum)
CT of the head and cervical spine Propranolol 10mg  up to 3x a day acutely for headache Amitriptyline at bedtime for headache and pain  Medrol dosepak for 3 days Discussed with patient at length. Rest is important in concussion recovery. Recommend shortened work days, working from home if she can, taking frequent breaks. No strenuous activity, limiting computer and reading time. Continue heating pad and Robaxin prn for muscular relief CT of the head an cervical spine for worsening head and neck pain, vision changes to evaluate for fractures, bleeding, vascular dissections, SAH/ SDH Physical therapy  Lidocaine patch up to 3 daily, 12 hours on and 12 hours off. Apply to neck for pain, Cervical pillow at night Amitriptyline 10mg  qhs to help with pain and sleep Medrol dose pak to help with inflammation and neck pain that is also likely contributing to headache Labwork    Post-Concussion Syndrome  A concussion is a brain injury from a direct hit (blow) to your head or body. This blow causes your brain to shake quickly back and forth inside your skull. This can damage brain cells and cause chemical changes in your brain. Concussions are usually not life-threatening but can cause several serious symptoms. Post-concussion syndrome is when symptoms that occur after a concussion last longer than normal. These symptoms can last from weeks to months. What are the causes? The cause of this condition is not known. It can happen whether your head injury was mild or severe. What increases the risk? You are more likely to develop this condition if:  You are female.  You are a child, teen, or young adult.  You had a past head injury.  You have a history of headaches.  You have depression or anxiety. What are the signs or symptoms? Physical symptoms  Headaches.  Tiredness.  Dizziness.  Weakness.  Blurry vision.  Sensitivity to light.  Hearing difficulties. Mental and emotional  symptoms  Memory difficulties.  Difficulty with concentration.  Difficulty sleeping or staying asleep.  Feeling irritable.  Anxiety or depression.  Difficulty learning new things. How is this diagnosed? This condition may be diagnosed based on:  Your symptoms.  A description of your injury.  Your medical history. Your health care provider may order other tests such as:  Brain function tests (neurological testing).  CT scan. How is this treated? Treatment for this condition may depend on your symptoms. Symptoms usually go away on their own over time. Treatments may include:  Medicines for headaches.  Resting your brain and body for a few days after your injury.  Rehabilitation therapy, such as: ? Physical or occupational therapy. This may include exercises to help with balance and dizziness. ? Mental health counseling. ? Speech therapy. ? Vision therapy. A brain and eye specialist can recommend treatments for vision problems. Follow these instructions at home: Medicines  Take over-the-counter and prescription medicines only as told by your health care provider.  Avoid opioid prescription pain medicines when recovering from a concussion. Activity  Limit your mental activities for the first few days after your injury, such as: ? Homework or job-related work. ? Complex thinking. ? Watching TV, and using a computer or phone. ? Playing memory games and puzzles. ? Gradually return to your normal activity level. If a certain activity brings on your symptoms, stop or slow down until you can do the activity without it triggering your symptoms.  Limit physical activity, such as exercise or sports, for the first few days after a concussion. Gradually return to  normal activity as told by your health care provider. ? If a certain activity brings on your symptoms, stop or slow down until you can do the activity without it triggering your symptoms.  Rest. Rest helps your brain  heal. Make sure you: ? Get plenty of sleep at night. Most adults should get at least 7-9 hours of sleep each night. ? Rest during the day. Take naps or rest breaks when you feel tired.  Do not do high-risk activities that could cause a second concussion, such as riding a bike or playing sports. Having another concussion before the first one has healed can be dangerous. General instructions  Do not drink alcohol until your health care provider says you can.  Keep track of the frequency and the severity of your symptoms. Give this information to your health care provider.  Keep all follow-up visits as directed by your health care provider. This is important. Contact a health care provider if:  Your symptoms do not improve.  You have another injury. Get help right away if you:  Have a severe or worsening headache.  Are confused.  Have trouble staying awake.  Pass out.  Vomit.  Have weakness or numbness in any part of your body.  Have a seizure.  Have trouble speaking. Summary  Post-concussion syndrome is when symptoms that occur after a concussion last longer than normal.  Symptoms usually go away on their own over time. Depending on your symptoms, you may need treatment, such as medicines or rehabilitation therapy.  Rest your brain and body for a few days after your injury. Gradually return to activities, as told by your health care provider.  Get plenty of sleep, and avoid alcohol and opioid pain medicines while recovering from a concussion. This information is not intended to replace advice given to you by your health care provider. Make sure you discuss any questions you have with your health care provider. Document Released: 05/08/2002 Document Revised: 09/08/2018 Document Reviewed: 12/21/2017 Elsevier Patient Education  [place].  Propranolol tablets What is this medicine? PROPRANOLOL (proe PRAN oh lole) is a beta-blocker. Beta-blockers reduce the  workload on the heart and help it to beat more regularly. This medicine is used to treat high blood pressure, to control irregular heart rhythms (arrhythmias) and to relieve chest pain caused by angina. It may also be helpful after a heart attack. This medicine is also used to prevent migraine headaches, relieve uncontrollable shaking (tremors), and help certain problems related to the thyroid gland and adrenal gland. This medicine may be used for other purposes; ask your health care provider or pharmacist if you have questions. COMMON BRAND NAME(S): Inderal What should I tell my health care provider before I take this medicine? They need to know if you have any of these conditions:  circulation problems or blood vessel disease  diabetes  history of heart attack or heart disease, vasospastic angina  kidney disease  liver disease  lung or breathing disease, like asthma or emphysema  pheochromocytoma  slow heart rate  thyroid disease  an unusual or allergic reaction to propranolol, other beta-blockers, medicines, foods, dyes, or preservatives  pregnant or trying to get pregnant  breast-feeding How should I use this medicine? Take this medicine by mouth with a glass of water. Follow the directions on the prescription label. Take your doses at regular intervals. Do not take your medicine more often than directed. Do not stop taking except on your the advice of your doctor or health  care professional. Talk to your pediatrician regarding the use of this medicine in children. Special care may be needed. Overdosage: If you think you have taken too much of this medicine contact a poison control center or emergency room at once. NOTE: This medicine is only for you. Do not share this medicine with others. What if I miss a dose? If you miss a dose, take it as soon as you can. If it is almost time for your next dose, take only that dose. Do not take double or extra doses. What may interact  with this medicine? Do not take this medicine with any of the following medications:  feverfew  phenothiazines like chlorpromazine, mesoridazine, prochlorperazine, thioridazine This medicine may also interact with the following medications:  aluminum hydroxide gel  antipyrine  antiviral medicines for HIV or AIDS  barbiturates like phenobarbital  certain medicines for blood pressure, heart disease, irregular heart beat  cimetidine  ciprofloxacin  diazepam  fluconazole  haloperidol  isoniazid  medicines for cholesterol like cholestyramine or colestipol  medicines for mental depression  medicines for migraine headache like almotriptan, eletriptan, frovatriptan, naratriptan, rizatriptan, sumatriptan, zolmitriptan  NSAIDs, medicines for pain and inflammation, like ibuprofen or naproxen  phenytoin  rifampin  teniposide  theophylline  thyroid medicines  tolbutamide  warfarin  zileuton This list may not describe all possible interactions. Give your health care provider a list of all the medicines, herbs, non-prescription drugs, or dietary supplements you use. Also tell them if you smoke, drink alcohol, or use illegal drugs. Some items may interact with your medicine. What should I watch for while using this medicine? Visit your doctor or health care professional for regular check ups. Check your blood pressure and pulse rate regularly. Ask your health care professional what your blood pressure and pulse rate should be, and when you should contact them. You may get drowsy or dizzy. Do not drive, use machinery, or do anything that needs mental alertness until you know how this drug affects you. Do not stand or sit up quickly, especially if you are an older patient. This reduces the risk of dizzy or fainting spells. Alcohol can make you more drowsy and dizzy. Avoid alcoholic drinks. This medicine may increase blood sugar. Ask your healthcare provider if changes in diet  or medicines are needed if you have diabetes. Do not treat yourself for coughs, colds, or pain while you are taking this medicine without asking your doctor or health care professional for advice. Some ingredients may increase your blood pressure. What side effects may I notice from receiving this medicine? Side effects that you should report to your doctor or health care professional as soon as possible:  allergic reactions like skin rash, itching or hives, swelling of the face, lips, or tongue  breathing problems  cold hands or feet  difficulty sleeping, nightmares  dry peeling skin  hallucinations  muscle cramps or weakness   signs and symptoms of high blood sugar such as being more thirsty or hungry or having to urinate more than normal. You may also feel very tired or have blurry vision.  slow heart rate  swelling of the legs and ankles  vomiting Side effects that usually do not require medical attention (report to your doctor or health care professional if they continue or are bothersome):  change in sex drive or performance  diarrhea  dry sore eyes  hair loss  nausea  weak or tired This list may not describe all possible side effects. Call your  doctor for medical advice about side effects. You may report side effects to FDA at 1-800-FDA-1088. Where should I keep my medicine? Keep out of the reach of children. Store at room temperature between 15 and 30 degrees C (59 and 86 degrees F). Protect from light. Throw away any unused medicine after the expiration date. NOTE: This sheet is a summary. It may not cover all possible information. If you have questions about this medicine, talk to your doctor, pharmacist, or health care provider.  2020 Elsevier/Gold Standard (2018-09-07 08:41:59)  Amitriptyline tablets What is this medicine? AMITRIPTYLINE (a mee TRIP ti leen) is used to treat depression. This medicine may be used for other purposes; ask your health care  provider or pharmacist if you have questions. COMMON BRAND NAME(S): Elavil, Vanatrip What should I tell my health care provider before I take this medicine? They need to know if you have any of these conditions:  an alcohol problem  asthma, difficulty breathing  bipolar disorder or schizophrenia  difficulty passing urine, prostate trouble  glaucoma  heart disease or previous heart attack  liver disease  over active thyroid  seizures  thoughts or plans of suicide, a previous suicide attempt, or family history of suicide attempt  an unusual or allergic reaction to amitriptyline, other medicines, foods, dyes, or preservatives  pregnant or trying to get pregnant  breast-feeding How should I use this medicine? Take this medicine by mouth with a drink of water. Follow the directions on the prescription label. You can take the tablets with or without food. Take your medicine at regular intervals. Do not take it more often than directed. Do not stop taking this medicine suddenly except upon the advice of your doctor. Stopping this medicine too quickly may cause serious side effects or your condition may worsen. A special MedGuide will be given to you by the pharmacist with each prescription and refill. Be sure to read this information carefully each time. Talk to your pediatrician regarding the use of this medicine in children. Special care may be needed. Overdosage: If you think you have taken too much of this medicine contact a poison control center or emergency room at once. NOTE: This medicine is only for you. Do not share this medicine with others. What if I miss a dose? If you miss a dose, take it as soon as you can. If it is almost time for your next dose, take only that dose. Do not take double or extra doses. What may interact with this medicine? Do not take this medicine with any of the following medications:  arsenic trioxide  certain medicines used to regulate abnormal  heartbeat or to treat other heart conditions  cisapride  droperidol  halofantrine  linezolid  MAOIs like Carbex, Eldepryl, Marplan, Nardil, and Parnate  methylene blue  other medicines for mental depression  phenothiazines like perphenazine, thioridazine and chlorpromazine  pimozide  probucol  procarbazine  sparfloxacin  St. John's Wort This medicine may also interact with the following medications:  atropine and related drugs like hyoscyamine, scopolamine, tolterodine and others  barbiturate medicines for inducing sleep or treating seizures, like phenobarbital  cimetidine  disulfiram  ethchlorvynol  thyroid hormones such as levothyroxine  ziprasidone This list may not describe all possible interactions. Give your health care provider a list of all the medicines, herbs, non-prescription drugs, or dietary supplements you use. Also tell them if you smoke, drink alcohol, or use illegal drugs. Some items may interact with your medicine. What should I watch  for while using this medicine? Tell your doctor if your symptoms do not get better or if they get worse. Visit your doctor or health care professional for regular checks on your progress. Because it may take several weeks to see the full effects of this medicine, it is important to continue your treatment as prescribed by your doctor. Patients and their families should watch out for new or worsening thoughts of suicide or depression. Also watch out for sudden changes in feelings such as feeling anxious, agitated, panicky, irritable, hostile, aggressive, impulsive, severely restless, overly excited and hyperactive, or not being able to sleep. If this happens, especially at the beginning of treatment or after a change in dose, call your health care professional. Dennis Bast may get drowsy or dizzy. Do not drive, use machinery, or do anything that needs mental alertness until you know how this medicine affects you. Do not stand or  sit up quickly, especially if you are an older patient. This reduces the risk of dizzy or fainting spells. Alcohol may interfere with the effect of this medicine. Avoid alcoholic drinks. Do not treat yourself for coughs, colds, or allergies without asking your doctor or health care professional for advice. Some ingredients can increase possible side effects. Your mouth may get dry. Chewing sugarless gum or sucking hard candy, and drinking plenty of water will help. Contact your doctor if the problem does not go away or is severe. This medicine may cause dry eyes and blurred vision. If you wear contact lenses you may feel some discomfort. Lubricating drops may help. See your eye doctor if the problem does not go away or is severe. This medicine can cause constipation. Try to have a bowel movement at least every 2 to 3 days. If you do not have a bowel movement for 3 days, call your doctor or health care professional. This medicine can make you more sensitive to the sun. Keep out of the sun. If you cannot avoid being in the sun, wear protective clothing and use sunscreen. Do not use sun lamps or tanning beds/booths. What side effects may I notice from receiving this medicine? Side effects that you should report to your doctor or health care professional as soon as possible:  allergic reactions like skin rash, itching or hives, swelling of the face, lips, or tongue  anxious  breathing problems  changes in vision  confusion  elevated mood, decreased need for sleep, racing thoughts, impulsive behavior  eye pain  fast, irregular heartbeat  feeling faint or lightheaded, falls  feeling agitated, angry, or irritable  fever with increased sweating  hallucination, loss of contact with reality  seizures  stiff muscles  suicidal thoughts or other mood changes  tingling, pain, or numbness in the feet or hands  trouble passing urine or change in the amount of urine  trouble  sleeping  unusually weak or tired  vomiting  yellowing of the eyes or skin Side effects that usually do not require medical attention (report to your doctor or health care professional if they continue or are bothersome):  change in sex drive or performance  change in appetite or weight  constipation  dizziness  dry mouth  nausea  tired  tremors  upset stomach This list may not describe all possible side effects. Call your doctor for medical advice about side effects. You may report side effects to FDA at 1-800-FDA-1088. Where should I keep my medicine? Keep out of the reach of children. Store at room temperature between 20  and 25 degrees C (68 and 77 degrees F). Throw away any unused medicine after the expiration date. NOTE: This sheet is a summary. It may not cover all possible information. If you have questions about this medicine, talk to your doctor, pharmacist, or health care provider.  2020 Elsevier/Gold Standard (2018-11-08 13:04:32)
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
## 2019-06-30 LAB — CBC
Hematocrit: 39 % (ref 34.0–46.6)
Hemoglobin: 12.8 g/dL (ref 11.1–15.9)
MCH: 27.9 pg (ref 26.6–33.0)
MCHC: 32.8 g/dL (ref 31.5–35.7)
MCV: 85 fL (ref 79–97)
Platelets: 193 10*3/uL (ref 150–450)
RBC: 4.59 x10E6/uL (ref 3.77–5.28)
RDW: 12.7 % (ref 11.7–15.4)
WBC: 4.7 10*3/uL (ref 3.4–10.8)

## 2019-06-30 LAB — COMPREHENSIVE METABOLIC PANEL
ALT: 19 IU/L (ref 0–32)
AST: 13 IU/L (ref 0–40)
Albumin/Globulin Ratio: 1.3 (ref 1.2–2.2)
Albumin: 4.1 g/dL (ref 3.8–4.8)
Alkaline Phosphatase: 58 IU/L (ref 39–117)
BUN/Creatinine Ratio: 18 (ref 9–23)
BUN: 13 mg/dL (ref 6–24)
Bilirubin Total: 0.3 mg/dL (ref 0.0–1.2)
CO2: 23 mmol/L (ref 20–29)
Calcium: 9.3 mg/dL (ref 8.7–10.2)
Chloride: 106 mmol/L (ref 96–106)
Creatinine, Ser: 0.71 mg/dL (ref 0.57–1.00)
GFR calc Af Amer: 121 mL/min/{1.73_m2} (ref >59)
GFR calc non Af Amer: 105 mL/min/{1.73_m2} (ref >59)
Globulin, Total: 3.1 g/dL (ref 1.5–4.5)
Glucose: 85 mg/dL (ref 65–99)
Potassium: 4.4 mmol/L (ref 3.5–5.2)
Sodium: 142 mmol/L (ref 134–144)
Total Protein: 7.2 g/dL (ref 6.0–8.5)

## 2019-06-30 LAB — TSH: TSH: 2.04 u[IU]/mL (ref 0.450–4.500)

## 2019-07-04 DIAGNOSIS — M50322 Other cervical disc degeneration at C5-C6 level: Secondary | ICD-10-CM | POA: Diagnosis not present

## 2019-07-04 DIAGNOSIS — M438X9 Other specified deforming dorsopathies, site unspecified: Secondary | ICD-10-CM | POA: Diagnosis not present

## 2019-07-04 DIAGNOSIS — Z87828 Personal history of other (healed) physical injury and trauma: Secondary | ICD-10-CM | POA: Diagnosis not present

## 2019-07-04 DIAGNOSIS — S134XXA Sprain of ligaments of cervical spine, initial encounter: Secondary | ICD-10-CM | POA: Diagnosis not present

## 2019-07-04 DIAGNOSIS — F0781 Postconcussional syndrome: Secondary | ICD-10-CM | POA: Diagnosis not present

## 2019-07-07 DIAGNOSIS — Z03818 Encounter for observation for suspected exposure to other biological agents ruled out: Secondary | ICD-10-CM | POA: Diagnosis not present

## 2019-07-10 DIAGNOSIS — R0789 Other chest pain: Secondary | ICD-10-CM | POA: Diagnosis not present

## 2019-07-10 DIAGNOSIS — R0602 Shortness of breath: Secondary | ICD-10-CM | POA: Diagnosis not present

## 2019-07-11 ENCOUNTER — Encounter (HOSPITAL_COMMUNITY): Payer: Self-pay | Admitting: Emergency Medicine

## 2019-07-11 ENCOUNTER — Emergency Department (HOSPITAL_COMMUNITY)
Admission: EM | Admit: 2019-07-11 | Discharge: 2019-07-11 | Disposition: A | Discharge status: Home / Self Care | Payer: BC Managed Care – PPO | Attending: Emergency Medicine | Admitting: Emergency Medicine

## 2019-07-11 ENCOUNTER — Emergency Department (HOSPITAL_COMMUNITY): Payer: BC Managed Care – PPO

## 2019-07-11 ENCOUNTER — Other Ambulatory Visit: Payer: BLUE CROSS/BLUE SHIELD

## 2019-07-11 ENCOUNTER — Other Ambulatory Visit: Payer: Self-pay

## 2019-07-11 DIAGNOSIS — R0789 Other chest pain: Secondary | ICD-10-CM | POA: Insufficient documentation

## 2019-07-11 DIAGNOSIS — R0602 Shortness of breath: Secondary | ICD-10-CM | POA: Diagnosis not present

## 2019-07-11 LAB — BASIC METABOLIC PANEL
Anion gap: 8 (ref 5–15)
BUN: 11 mg/dL (ref 6–20)
CO2: 23 mmol/L (ref 22–32)
Calcium: 8.7 mg/dL — ABNORMAL LOW (ref 8.9–10.3)
Chloride: 108 mmol/L (ref 98–111)
Creatinine, Ser: 0.78 mg/dL (ref 0.44–1.00)
GFR calc Af Amer: >60 mL/min (ref >60)
GFR calc non Af Amer: >60 mL/min (ref >60)
Glucose, Bld: 94 mg/dL (ref 70–99)
Potassium: 3.8 mmol/L (ref 3.5–5.1)
Sodium: 139 mmol/L (ref 135–145)

## 2019-07-11 LAB — CBC WITH DIFFERENTIAL/PLATELET
Abs Immature Granulocytes: 0.01 10*3/uL (ref 0.00–0.07)
Basophils Absolute: 0 10*3/uL (ref 0.0–0.1)
Basophils Relative: 1 %
Eosinophils Absolute: 0.1 10*3/uL (ref 0.0–0.5)
Eosinophils Relative: 3 %
HCT: 39 % (ref 36.0–46.0)
HCT: 38 % (ref 36.0–46.0)
Hemoglobin: 13 g/dL (ref 12.0–15.0)
Hemoglobin: 12.4 g/dL (ref 12.0–15.0)
Immature Granulocytes: 0 %
Lymphocytes Relative: 42 %
Lymphs Abs: 1.4 10*3/uL (ref 0.7–4.0)
MCH: 28 pg (ref 26.0–34.0)
MCH: 28.1 pg (ref 26.0–34.0)
MCHC: 33.3 g/dL (ref 30.0–36.0)
MCHC: 32.6 g/dL (ref 30.0–36.0)
MCV: 84.1 fL (ref 80.0–100.0)
MCV: 86.2 fL (ref 80.0–100.0)
Monocytes Absolute: 0.3 10*3/uL (ref 0.1–1.0)
Monocytes Relative: 8 %
Neutro Abs: 1.5 10*3/uL — ABNORMAL LOW (ref 1.7–7.7)
Neutrophils Relative %: 46 %
Platelets: 191 10*3/uL (ref 150–400)
RBC: 4.64 MIL/uL (ref 3.87–5.11)
RBC: 4.41 MIL/uL (ref 3.87–5.11)
RDW: 12.6 % (ref 11.5–15.5)
RDW: 12.8 % (ref 11.5–15.5)
WBC: 2.7 10*3/uL — ABNORMAL LOW (ref 4.0–10.5)
WBC: 3.2 10*3/uL — ABNORMAL LOW (ref 4.0–10.5)
nRBC: 0 % (ref 0.0–0.2)
nRBC: 0 % (ref 0.0–0.2)

## 2019-07-11 LAB — D-DIMER, QUANTITATIVE: D-Dimer, Quant: 0.48 ug/mL-FEU (ref 0.00–0.50)

## 2019-07-11 LAB — TROPONIN I (HIGH SENSITIVITY): Troponin I (High Sensitivity): 3 ng/L (ref <18)

## 2019-07-11 NOTE — Discharge Instructions (Signed)
There is no clear cause for your shortness of breath today.  That does not mean there is nothing wrong, and you will need to follow-up with a primary care physician for further outpatient work-up.  If at any point you develop worsening shortness of breath, chest pain, or any other new/concerning symptoms then return to the ER for evaluation.
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
## 2019-07-12 ENCOUNTER — Other Ambulatory Visit: Payer: Self-pay | Admitting: Neurology

## 2019-07-12 MED ORDER — ALBUTEROL SULFATE HFA 108 (90 BASE) MCG/ACT IN AERS: 2.0000 | INHALATION_SPRAY | Freq: Four times a day (QID) | RESPIRATORY_TRACT | 6 refills | Status: AC | PRN

## 2019-07-17 ENCOUNTER — Other Ambulatory Visit: Payer: Self-pay | Admitting: Neurology

## 2019-07-17 DIAGNOSIS — R0602 Shortness of breath: Secondary | ICD-10-CM

## 2019-07-17 DIAGNOSIS — J45909 Unspecified asthma, uncomplicated: Secondary | ICD-10-CM

## 2019-07-17 MED ORDER — PREDNISONE 20 MG PO TABS: 60.0000 mg | ORAL_TABLET | Freq: Every day | ORAL | 1 refills | Status: DC

## 2019-07-20 ENCOUNTER — Ambulatory Visit: Payer: BC Managed Care – PPO | Admitting: Pulmonary Disease

## 2019-07-20 ENCOUNTER — Other Ambulatory Visit: Payer: Self-pay

## 2019-07-20 ENCOUNTER — Encounter: Payer: Self-pay | Admitting: Pulmonary Disease

## 2019-07-20 VITALS — BP 124/68 | HR 55 | Temp 98.2°F | Ht 61.0 in | Wt 203.6 lb

## 2019-07-20 DIAGNOSIS — R0602 Shortness of breath: Secondary | ICD-10-CM | POA: Diagnosis not present

## 2019-07-20 MED ORDER — BUDESONIDE-FORMOTEROL FUMARATE 160-4.5 MCG/ACT IN AERO: 2.0000 | INHALATION_SPRAY | Freq: Two times a day (BID) | RESPIRATORY_TRACT | 0 refills | Status: DC

## 2019-07-20 NOTE — Patient Instructions (Signed)
We will give you samples of Symbicort. Continue with albuterol as needed Schedule you for pulmonary function test Follow-up in 1 to 2 months
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
## 2019-07-24 ENCOUNTER — Telehealth: Payer: Self-pay | Admitting: Pulmonary Disease

## 2019-07-24 DIAGNOSIS — R0602 Shortness of breath: Secondary | ICD-10-CM

## 2019-07-24 MED ORDER — FLUTICASONE FUROATE-VILANTEROL 200-25 MCG/INH IN AEPB: 1.0000 | INHALATION_SPRAY | Freq: Every day | RESPIRATORY_TRACT | 1 refills | Status: DC

## 2019-07-24 NOTE — Telephone Encounter (Signed)
Try Breo 200 or dulera 200

## 2019-07-24 NOTE — Telephone Encounter (Signed)
Called the patient back and advised her of Dr. Matilde Bash recommendations. Advised the patient to stop taking the Symbicort and that we are not aware what the cost will be for the [facility] or Dulera, but one of them will be sent to the pharmacy.   Advised the patient that if the medication sent does work, to call and let us know so refills can be sent to the pharmacy.  Patient agreed and voiced understanding. Prescription sent. Nothing further needed at this time.

## 2019-07-24 NOTE — Telephone Encounter (Signed)
Spoke with patient. She stated that she was seen by Dr. Vaughan Browner on 07/20/19 as a new patient. She was given a sample of Symbicort 160 after her OV. She has been using this inhaler up until Sunday after she realized that the inhaler was making her SOB worse. She has also developed a non-productive cough as well as chest tightness. Per patient, this is the only medication that she has started recently. Denied any symptoms of throat numbness or feeling like it is about to close up.   She wants to know if she could be prescribed another inhaler.   Pharmacy is [facility] on [address].   Dr. Vaughan Browner, please advise. Thanks!

## 2019-08-03 ENCOUNTER — Other Ambulatory Visit: Payer: Self-pay | Admitting: Neurology

## 2019-08-03 DIAGNOSIS — Z119 Encounter for screening for infectious and parasitic diseases, unspecified: Secondary | ICD-10-CM

## 2019-08-03 NOTE — Addendum Note (Signed)
Addended by: Inis Sizer D on: 08/03/2019 04:04 PM   Modules accepted: Orders

## 2019-08-04 DIAGNOSIS — M7061 Trochanteric bursitis, right hip: Secondary | ICD-10-CM | POA: Diagnosis not present

## 2019-08-04 DIAGNOSIS — M4302 Spondylolysis, cervical region: Secondary | ICD-10-CM | POA: Diagnosis not present

## 2019-08-04 LAB — SAR COV2 SEROLOGY (COVID19)AB(IGG),IA: SARS-CoV-2 Ab, IgG: NEGATIVE

## 2019-08-16 ENCOUNTER — Telehealth: Payer: Self-pay | Admitting: *Deleted

## 2019-08-16 NOTE — Telephone Encounter (Signed)
FMLA forms completed, signed and sent to medical records.

## 2019-09-01 ENCOUNTER — Other Ambulatory Visit: Payer: Self-pay | Admitting: Neurology

## 2019-09-01 MED ORDER — IBUPROFEN 800 MG PO TABS: 800.0000 mg | ORAL_TABLET | Freq: Three times a day (TID) | ORAL | 2 refills | Status: DC | PRN

## 2019-09-15 DIAGNOSIS — M7061 Trochanteric bursitis, right hip: Secondary | ICD-10-CM | POA: Diagnosis not present

## 2019-09-15 DIAGNOSIS — M4302 Spondylolysis, cervical region: Secondary | ICD-10-CM | POA: Diagnosis not present

## 2019-09-30 ENCOUNTER — Other Ambulatory Visit (HOSPITAL_COMMUNITY)
Admission: RE | Admit: 2019-09-30 | Discharge: 2019-09-30 | Disposition: A | Discharge status: Home / Self Care | Payer: BC Managed Care – PPO | Source: Ambulatory Visit | Attending: Pulmonary Disease | Admitting: Pulmonary Disease

## 2019-09-30 DIAGNOSIS — Z01812 Encounter for preprocedural laboratory examination: Secondary | ICD-10-CM | POA: Insufficient documentation

## 2019-09-30 DIAGNOSIS — Z20828 Contact with and (suspected) exposure to other viral communicable diseases: Secondary | ICD-10-CM | POA: Insufficient documentation

## 2019-10-01 LAB — NOVEL CORONAVIRUS, NAA (HOSP ORDER, SEND-OUT TO REF LAB; TAT 18-24 HRS): SARS-CoV-2, NAA: NOT DETECTED

## 2019-10-01 NOTE — Progress Notes (Signed)
Negative covid test. This is good news.  Wyn Quaker FNP
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
## 2019-10-05 ENCOUNTER — Encounter: Payer: Self-pay | Admitting: Pulmonary Disease

## 2019-10-05 ENCOUNTER — Ambulatory Visit: Payer: BC Managed Care – PPO | Admitting: Pulmonary Disease

## 2019-10-05 ENCOUNTER — Other Ambulatory Visit: Payer: Self-pay

## 2019-10-05 ENCOUNTER — Ambulatory Visit (INDEPENDENT_AMBULATORY_CARE_PROVIDER_SITE_OTHER): Payer: BC Managed Care – PPO | Admitting: Pulmonary Disease

## 2019-10-05 VITALS — BP 120/76 | HR 76 | Temp 98.0°F | Ht 63.8 in | Wt 204.0 lb

## 2019-10-05 DIAGNOSIS — R0602 Shortness of breath: Secondary | ICD-10-CM | POA: Diagnosis not present

## 2019-10-05 DIAGNOSIS — F0781 Postconcussional syndrome: Secondary | ICD-10-CM | POA: Diagnosis not present

## 2019-10-05 DIAGNOSIS — Z87898 Personal history of other specified conditions: Secondary | ICD-10-CM | POA: Diagnosis not present

## 2019-10-05 LAB — PULMONARY FUNCTION TEST
DL/VA % pred: 122 %
DL/VA: 5.37 ml/min/mmHg/L
DLCO unc % pred: 93 %
DLCO unc: 20.18 ml/min/mmHg
FEF 25-75 Post: 4.63 L/sec
FEF 25-75 Pre: 4.08 L/sec
FEF2575-%Change-Post: 13 %
FEF2575-%Pred-Post: 171 %
FEF2575-%Pred-Pre: 150 %
FEV1-%Change-Post: 4 %
FEV1-%Pred-Post: 104 %
FEV1-%Pred-Pre: 100 %
FEV1-Post: 2.58 L
FEV1-Pre: 2.46 L
FEV1FVC-%Change-Post: 1 %
FEV1FVC-%Pred-Pre: 109 %
FEV6-%Change-Post: 3 %
FEV6-%Pred-Post: 94 %
FEV6-%Pred-Pre: 91 %
FEV6-Post: 2.79 L
FEV6-Pre: 2.7 L
FEV6FVC-%Pred-Post: 102 %
FEV6FVC-%Pred-Pre: 102 %
FVC-%Change-Post: 3 %
FVC-%Pred-Post: 92 %
FVC-%Pred-Pre: 89 %
FVC-Post: 2.79 L
FVC-Pre: 2.7 L
Post FEV1/FVC ratio: 92 %
Post FEV6/FVC ratio: 100 %
Pre FEV1/FVC ratio: 91 %
Pre FEV6/FVC Ratio: 100 %
RV % pred: 116 %
RV: 1.9 L
TLC % pred: 99 %
TLC: 5.02 L

## 2019-10-05 NOTE — Assessment & Plan Note (Signed)
Plan: °Continue follow-up with neurology ° °

## 2019-10-05 NOTE — Progress Notes (Signed)
Full PFT performed today. °

## 2019-10-05 NOTE — Patient Instructions (Addendum)
You were seen today by Lauraine Rinne, NP  for:   1. SOB (shortness of breath)  - Cardiopulmonary exercise test; Future  Walk in office today stable   Only use your albuterol as a rescue medication to be used if you can't catch your breath by resting or doing a relaxed purse lip breathing pattern.  - The less you use it, the better it will work when you need it. - Ok to use up to 2 puffs  every 4 hours if you must but call for immediate appointment if use goes up over your usual need - Don't leave home without it !!  (think of it like the spare tire for your car)     We recommend today:  Orders Placed This Encounter  Procedures  . Cardiopulmonary exercise test    Exercise induced bronchospasm    Standing Status:   Future    Standing Expiration Date:   10/04/2020   Orders Placed This Encounter  Procedures  . Cardiopulmonary exercise test   No orders of the defined types were placed in this encounter.   Follow Up:    Return in about 6 weeks (around 11/16/2019), or if symptoms worsen or fail to improve, for Follow up with Dr. Vaughan Browner.  After Exercise test   Please do your part to reduce the spread of COVID-19:      Reduce your risk of any infection  and COVID19 by using the similar precautions used for avoiding the common cold or flu:  Marland Kitchen Wash your hands often with soap and warm water for at least 20 seconds.  If soap and water are not readily available, use an alcohol-based hand sanitizer with at least 60% alcohol.  . If coughing or sneezing, cover your mouth and nose by coughing or sneezing into the elbow areas of your shirt or coat, into a tissue or into your sleeve (not your hands). Langley Gauss A MASK when in public  . Avoid shaking hands with others and consider head nods or verbal greetings only. . Avoid touching your eyes, nose, or mouth with unwashed hands.  . Avoid close contact with people who are sick. . Avoid places or events with large numbers of people in one  location, like concerts or sporting events. . If you have some symptoms but not all symptoms, continue to monitor at home and seek medical attention if your symptoms worsen. . If you are having a medical emergency, call 911.   [place] / e-Visit: [email or web address]         [facility]: [facility]: [id number]                   [facility]: [id number]     It is flu season:   >>> Best ways to protect herself from the flu: Receive the yearly flu vaccine, practice good hand hygiene washing with soap and also using hand sanitizer when available, eat a nutritious meals, get adequate rest, hydrate appropriately   Please contact the office if your symptoms worsen or you have concerns that you are not improving.   Thank you for choosing [facility] for your healthcare, and for allowing [place] to partner with you on your healthcare journey. I am thankful to be able to provide care to you today.   Wyn Quaker FNP-C

## 2019-10-05 NOTE — Assessment & Plan Note (Addendum)
Reviewed pulmonary function testing today Patient intolerant of maintenance inhalers  Plan: Discussed case with Dr. Vaughan Browner today.    Will order cardiopulmonary exercise test. Walk today in office Can use albuterol as needed for shortness of breath or wheezing

## 2019-10-06 DIAGNOSIS — R293 Abnormal posture: Secondary | ICD-10-CM | POA: Diagnosis not present

## 2019-10-06 DIAGNOSIS — M542 Cervicalgia: Secondary | ICD-10-CM | POA: Diagnosis not present

## 2019-10-06 DIAGNOSIS — M6281 Muscle weakness (generalized): Secondary | ICD-10-CM | POA: Diagnosis not present

## 2019-10-06 DIAGNOSIS — M25551 Pain in right hip: Secondary | ICD-10-CM | POA: Diagnosis not present

## 2019-10-09 DIAGNOSIS — R51 Headache with orthostatic component, not elsewhere classified: Secondary | ICD-10-CM | POA: Diagnosis not present

## 2019-10-09 DIAGNOSIS — M542 Cervicalgia: Secondary | ICD-10-CM | POA: Diagnosis not present

## 2019-10-09 DIAGNOSIS — M6281 Muscle weakness (generalized): Secondary | ICD-10-CM | POA: Diagnosis not present

## 2019-10-09 DIAGNOSIS — M25551 Pain in right hip: Secondary | ICD-10-CM | POA: Diagnosis not present

## 2019-10-09 DIAGNOSIS — R293 Abnormal posture: Secondary | ICD-10-CM | POA: Diagnosis not present

## 2019-10-13 DIAGNOSIS — M7061 Trochanteric bursitis, right hip: Secondary | ICD-10-CM | POA: Diagnosis not present

## 2019-10-13 DIAGNOSIS — R51 Headache with orthostatic component, not elsewhere classified: Secondary | ICD-10-CM | POA: Diagnosis not present

## 2019-10-13 DIAGNOSIS — R519 Headache, unspecified: Secondary | ICD-10-CM | POA: Diagnosis not present

## 2019-10-13 DIAGNOSIS — M25551 Pain in right hip: Secondary | ICD-10-CM | POA: Diagnosis not present

## 2019-10-13 DIAGNOSIS — M542 Cervicalgia: Secondary | ICD-10-CM | POA: Diagnosis not present

## 2019-10-13 DIAGNOSIS — M4302 Spondylolysis, cervical region: Secondary | ICD-10-CM | POA: Diagnosis not present

## 2019-10-13 DIAGNOSIS — R293 Abnormal posture: Secondary | ICD-10-CM | POA: Diagnosis not present

## 2019-10-17 ENCOUNTER — Other Ambulatory Visit: Payer: Self-pay | Admitting: Neurology

## 2019-10-17 DIAGNOSIS — M542 Cervicalgia: Secondary | ICD-10-CM | POA: Diagnosis not present

## 2019-10-17 DIAGNOSIS — R293 Abnormal posture: Secondary | ICD-10-CM | POA: Diagnosis not present

## 2019-10-17 DIAGNOSIS — M25551 Pain in right hip: Secondary | ICD-10-CM | POA: Diagnosis not present

## 2019-10-17 DIAGNOSIS — R51 Headache with orthostatic component, not elsewhere classified: Secondary | ICD-10-CM | POA: Diagnosis not present

## 2019-10-17 DIAGNOSIS — M6281 Muscle weakness (generalized): Secondary | ICD-10-CM | POA: Diagnosis not present

## 2019-10-17 MED ORDER — PHENTERMINE HCL 37.5 MG PO CAPS: 37.5000 mg | ORAL_CAPSULE | ORAL | 5 refills | Status: DC

## 2019-10-20 DIAGNOSIS — R519 Headache, unspecified: Secondary | ICD-10-CM | POA: Diagnosis not present

## 2019-10-20 DIAGNOSIS — M25551 Pain in right hip: Secondary | ICD-10-CM | POA: Diagnosis not present

## 2019-10-20 DIAGNOSIS — M542 Cervicalgia: Secondary | ICD-10-CM | POA: Diagnosis not present

## 2019-10-20 DIAGNOSIS — R51 Headache with orthostatic component, not elsewhere classified: Secondary | ICD-10-CM | POA: Diagnosis not present

## 2019-10-20 DIAGNOSIS — R293 Abnormal posture: Secondary | ICD-10-CM | POA: Diagnosis not present

## 2019-10-27 DIAGNOSIS — R51 Headache with orthostatic component, not elsewhere classified: Secondary | ICD-10-CM | POA: Diagnosis not present

## 2019-10-27 DIAGNOSIS — R293 Abnormal posture: Secondary | ICD-10-CM | POA: Diagnosis not present

## 2019-10-27 DIAGNOSIS — R519 Headache, unspecified: Secondary | ICD-10-CM | POA: Diagnosis not present

## 2019-10-27 DIAGNOSIS — M542 Cervicalgia: Secondary | ICD-10-CM | POA: Diagnosis not present

## 2019-10-27 DIAGNOSIS — M25551 Pain in right hip: Secondary | ICD-10-CM | POA: Diagnosis not present

## 2019-10-30 ENCOUNTER — Other Ambulatory Visit (HOSPITAL_COMMUNITY)
Admission: RE | Admit: 2019-10-30 | Discharge: 2019-10-30 | Disposition: A | Discharge status: Home / Self Care | Payer: BC Managed Care – PPO | Source: Ambulatory Visit | Attending: Pulmonary Disease | Admitting: Pulmonary Disease

## 2019-10-30 DIAGNOSIS — Z20828 Contact with and (suspected) exposure to other viral communicable diseases: Secondary | ICD-10-CM | POA: Insufficient documentation

## 2019-10-30 DIAGNOSIS — Z01812 Encounter for preprocedural laboratory examination: Secondary | ICD-10-CM | POA: Insufficient documentation

## 2019-10-31 LAB — NOVEL CORONAVIRUS, NAA (HOSP ORDER, SEND-OUT TO REF LAB; TAT 18-24 HRS): SARS-CoV-2, NAA: NOT DETECTED

## 2019-10-31 NOTE — Progress Notes (Signed)
SARS-CoV-2 test is negative.  This is good news.  Proceed forward with work-up as planned.  Arlis Everly, FNP 

## 2019-11-02 ENCOUNTER — Ambulatory Visit (HOSPITAL_COMMUNITY): Payer: BC Managed Care – PPO | Attending: Pulmonary Disease

## 2019-11-02 ENCOUNTER — Other Ambulatory Visit: Payer: Self-pay

## 2019-11-02 DIAGNOSIS — R0602 Shortness of breath: Secondary | ICD-10-CM | POA: Insufficient documentation

## 2019-11-05 NOTE — Progress Notes (Signed)
Cardiopulmonary exercise test results of come back showing functional capacity.  Testing shows that there is no indication for cardiopulmonary limitation.  Likely will mentating factor to exercise is patient's body habitus.  Keep follow-up with our office this month with Dr. Vaughan Browner where we can further review these results.  Wyn Quaker, FNP

## 2019-11-06 ENCOUNTER — Telehealth: Payer: Self-pay | Admitting: Pulmonary Disease

## 2019-11-06 NOTE — Telephone Encounter (Signed)
Call returned to patient, confirmed DOB, made aware ofresults per BM:  Notes recorded by Lauraine Rinne, NP on 11/05/2019 at 9:31 PM EST  Cardiopulmonary exercise test results of come back showing functional capacity. Testing shows that there is no indication for cardiopulmonary limitation. Likely will mentating factor to exercise is patient's body habitus.   Keep follow-up with our office this month with Dr. Vaughan Browner where we can further review these results.   Wyn Quaker, FNP  Voiced understanding. Confirmed f/u appt. Nothing further needed at this time.

## 2019-11-16 ENCOUNTER — Encounter: Payer: Self-pay | Admitting: Pulmonary Disease

## 2019-11-16 ENCOUNTER — Ambulatory Visit (INDEPENDENT_AMBULATORY_CARE_PROVIDER_SITE_OTHER): Payer: BC Managed Care – PPO | Admitting: Pulmonary Disease

## 2019-11-16 ENCOUNTER — Other Ambulatory Visit: Payer: Self-pay

## 2019-11-16 VITALS — BP 118/70 | HR 77 | Temp 98.2°F | Ht 64.0 in | Wt 204.4 lb

## 2019-11-16 DIAGNOSIS — E669 Obesity, unspecified: Secondary | ICD-10-CM | POA: Diagnosis not present

## 2019-11-16 DIAGNOSIS — Z6835 Body mass index (BMI) 35.0-35.9, adult: Secondary | ICD-10-CM

## 2019-11-16 DIAGNOSIS — E66812 Obesity, class 2: Secondary | ICD-10-CM

## 2019-11-16 NOTE — Patient Instructions (Signed)
I have reviewed your labs, PFTs which were normal Cardiopulmonary exercise test suggest that your dyspnea may be due to deconditioning and body habitus We will refer you to weight loss clinic at [facility] Please make echo to lose at least 20 pounds over the next 6 months Follow-up in clinic in 6 months.
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
## 2019-11-28 ENCOUNTER — Other Ambulatory Visit: Payer: Self-pay | Admitting: Neurology

## 2019-11-28 MED ORDER — AMOXICILLIN-POT CLAVULANATE 875-125 MG PO TABS: 1.0000 | ORAL_TABLET | Freq: Two times a day (BID) | ORAL | 0 refills | Status: DC

## 2019-11-29 ENCOUNTER — Other Ambulatory Visit: Payer: Self-pay | Admitting: Neurology

## 2019-12-04 ENCOUNTER — Other Ambulatory Visit: Payer: Self-pay | Admitting: Neurology

## 2019-12-04 DIAGNOSIS — Z1152 Encounter for screening for COVID-19: Secondary | ICD-10-CM

## 2019-12-04 NOTE — Progress Notes (Signed)
covid

## 2019-12-04 NOTE — Addendum Note (Signed)
Addended by: Inis Sizer D on: 12/04/2019 02:42 PM   Modules accepted: Orders

## 2019-12-05 LAB — SAR COV2 SEROLOGY (COVID19)AB(IGG),IA: DiaSorin SARS-CoV-2 Ab, IgG: NEGATIVE

## 2019-12-11 ENCOUNTER — Other Ambulatory Visit: Payer: Self-pay

## 2019-12-11 ENCOUNTER — Encounter: Payer: Self-pay | Admitting: Neurology

## 2019-12-11 ENCOUNTER — Ambulatory Visit (INDEPENDENT_AMBULATORY_CARE_PROVIDER_SITE_OTHER): Payer: BC Managed Care – PPO | Admitting: Neurology

## 2019-12-11 ENCOUNTER — Other Ambulatory Visit: Payer: Self-pay | Admitting: Certified Nurse Midwife

## 2019-12-11 DIAGNOSIS — R300 Dysuria: Secondary | ICD-10-CM | POA: Diagnosis not present

## 2019-12-11 DIAGNOSIS — F900 Attention-deficit hyperactivity disorder, predominantly inattentive type: Secondary | ICD-10-CM

## 2019-12-11 DIAGNOSIS — Z1152 Encounter for screening for COVID-19: Secondary | ICD-10-CM

## 2019-12-11 DIAGNOSIS — Z1231 Encounter for screening mammogram for malignant neoplasm of breast: Secondary | ICD-10-CM

## 2019-12-11 MED ORDER — AMPHETAMINE-DEXTROAMPHETAMINE 10 MG PO TABS: 10.0000 mg | ORAL_TABLET | Freq: Two times a day (BID) | ORAL | 0 refills | Status: DC
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
## 2019-12-11 NOTE — Patient Instructions (Signed)
Amphetamine; Dextroamphetamine tablets What is this medicine? AMPHETAMINE; DEXTROAMPHETAMINE(am FET a meen; dex troe am FET a meen) is used to treat attention-deficit hyperactivity disorder (ADHD). It may also be used for narcolepsy. Federal law prohibits giving this medicine to any person other than the person for whom it was prescribed. Do not share this medicine with anyone else. This medicine may be used for other purposes; ask your health care provider or pharmacist if you have questions. COMMON BRAND NAME(S): Adderall What should I tell my health care provider before I take this medicine? They need to know if you have any of these conditions:  anxiety or panic attacks  circulation problems in fingers and toes  glaucoma  hardening or blockages of the arteries or heart blood vessels  heart disease or a heart defect  high blood pressure  history of a drug or alcohol abuse problem  history of stroke  kidney disease  liver disease  mental illness  seizures  suicidal thoughts, plans, or attempt; a previous suicide attempt by you or a family member  thyroid disease  Tourette's syndrome  an unusual or allergic reaction to dextroamphetamine, other amphetamines, other medicines, foods, dyes, or preservatives  pregnant or trying to get pregnant  breast-feeding How should I use this medicine? Take this medicine by mouth with a glass of water. Follow the directions on the prescription label. Take your doses at regular intervals. Do not take your medicine more often than directed. Do not suddenly stop your medicine. You must gradually reduce the dose or you may feel withdrawal effects. Ask your doctor or health care professional for advice. Talk to your pediatrician regarding the use of this medicine in children. Special care may be needed. While this drug may be prescribed for children as young as 3 years for selected conditions, precautions do apply. Overdosage: If you think  you have taken too much of this medicine contact a poison control center or emergency room at once. NOTE: This medicine is only for you. Do not share this medicine with others. What if I miss a dose? If you miss a dose, take it as soon as you can. If it is almost time for your next dose, take only that dose. Do not take double or extra doses. What may interact with this medicine? Do not take this medicine with any of the following medications:  MAOIs like Carbex, Eldepryl, Marplan, Nardil, and Parnate  other stimulant medicines for attention disorders This medicine may also interact with the following medications:  acetazolamide  ammonium chloride  antacids  ascorbic acid  atomoxetine  caffeine  certain medicines for blood pressure  certain medicines for depression, anxiety, or psychotic disturbances  certain medicines for seizures like carbamazepine, phenobarbital, phenytoin  certain medicines for stomach problems like cimetidine, ranitidine, famotidine, esomeprazole, omeprazole, lansoprazole, pantoprazole  lithium  medicines for colds and breathing difficulties  medicines for diabetes  medicines or dietary supplements for weight loss or to stay awake  methenamine  narcotic medicines for pain  quinidine  ritonavir  sodium bicarbonate  St. John's wort This list may not describe all possible interactions. Give your health care provider a list of all the medicines, herbs, non-prescription drugs, or dietary supplements you use. Also tell them if you smoke, drink alcohol, or use illegal drugs. Some items may interact with your medicine. What should I watch for while using this medicine? Visit your doctor or health care professional for regular checks on your progress. This prescription requires that you follow  special procedures with your doctor and pharmacy. You will need to have a new written prescription from your doctor every time you need a refill. This medicine  may affect your concentration, or hide signs of tiredness. Until you know how this medicine affects you, do not drive, ride a bicycle, use machinery, or do anything that needs mental alertness. Tell your doctor or health care professional if this medicine loses its effects, or if you feel you need to take more than the prescribed amount. Do not change the dosage without talking to your doctor or health care professional. Decreased appetite is a common side effect when starting this medicine. Eating small, frequent meals or snacks can help. Talk to your doctor if you continue to have poor eating habits. Height and weight growth of a child taking this medicine will be monitored closely. Do not take this medicine close to bedtime. It may prevent you from sleeping. If you are going to need surgery, a MRI, CT scan, or other procedure, tell your doctor that you are taking this medicine. You may need to stop taking this medicine before the procedure. Tell your doctor or healthcare professional right away if you notice unexplained wounds on your fingers and toes while taking this medicine. You should also tell your healthcare provider if you experience numbness or pain, changes in the skin color, or sensitivity to temperature in your fingers or toes. What side effects may I notice from receiving this medicine? Side effects that you should report to your doctor or health care professional as soon as possible:  allergic reactions like skin rash, itching or hives, swelling of the face, lips, or tongue  anxious  breathing problems  changes in emotions or moods  changes in vision  chest pain or chest tightness  fast, irregular heartbeat  fingers or toes feel numb, cool, painful  hallucination, loss of contact with reality  high blood pressure  males: prolonged or painful erection  seizures  signs and symptoms of serotonin syndrome like confusion, increased sweating, fever, tremor, stiff muscles,  diarrhea  signs and symptoms of a stroke like changes in vision; confusion; trouble speaking or understanding; severe headaches; sudden numbness or weakness of the face, arm or leg; trouble walking; dizziness; loss of balance or coordination  suicidal thoughts or other mood changes  uncontrollable head, mouth, neck, arm, or leg movements Side effects that usually do not require medical attention (report to your doctor or health care professional if they continue or are bothersome):  dry mouth  headache  irritability  loss of appetite  nausea  trouble sleeping  weight loss This list may not describe all possible side effects. Call your doctor for medical advice about side effects. You may report side effects to FDA at 1-800-FDA-1088. Where should I keep my medicine? Keep out of the reach of children. This medicine can be abused. Keep your medicine in a safe place to protect it from theft. Do not share this medicine with anyone. Selling or giving away this medicine is dangerous and against the law. Store at room temperature between 15 and 30 degrees C (59 and 86 degrees F). Keep container tightly closed. Throw away any unused medicine after the expiration date. Dispose of properly. This medicine may cause accidental overdose and death if it is taken by other adults, children, or pets. Mix any unused medicine with a substance like cat litter or coffee grounds. Then throw the medicine away in a sealed container like a sealed bag or   a coffee can with a lid. Do not use the medicine after the expiration date. NOTE: This sheet is a summary. It may not cover all possible information. If you have questions about this medicine, talk to your doctor, pharmacist, or health care provider.  2020 Elsevier/Gold Standard (2017-01-08 [id number])  Attention Deficit Hyperactivity Disorder, Adult Attention deficit hyperactivity disorder (ADHD) is a mental health disorder that starts during childhood  (neurodevelopmental disorder). For many people with ADHD, the disorder continues into the adult years. Treatment can help you manage your symptoms. What are the causes? The exact cause of ADHD is not known. Most experts believe genetics and environmental factors contribute to ADHD. What increases the risk? The following factors may make you more likely to develop this condition:  Having a family history of ADHD.  Being female.  Being born to a mother who smoked or drank alcohol during pregnancy.  Being exposed to lead or other toxins in the womb or early in life.  Being born before 59 weeks of pregnancy (prematurely) or at a low birth weight.  Having experienced a brain injury. What are the signs or symptoms? Symptoms of this condition depend on the type of ADHD. The two main types are inattentive and hyperactive-impulsive. Some people may have symptoms of both types. Symptoms of the inattentive type include:  Difficulty paying attention.  Making careless mistakes.  Not following instructions.  Being disorganized.  Avoiding tasks that require time and attention.  Losing and forgetting things.  Being easily distracted. Symptoms of the hyperactive-impulsive type include:  Restlessness.  Talking too much.  Interrupting.  Difficulty with: ? Sitting still. ? Feeling motivated. ? Relaxing. ? Waiting in line or waiting for a turn. In adults, this condition may lead to certain problems, such as:  Keeping jobs.  Performing tasks at work.  Having stable relationships.  Being on time or keeping to a schedule. How is this diagnosed? This condition is diagnosed based on your current symptoms and your history of symptoms. The diagnosis can be made by a health care provider such as a primary care provider or a mental health care specialist. Your health care provider may use a symptom checklist or a behavior rating scale to evaluate your symptoms. He or she may also want to  talk with people who have observed your behaviors throughout your life. How is this treated? This condition can be treated with medicines and behavior therapy. Medicines may be the best option to reduce impulsive behaviors and improve attention. Your health care provider may recommend:  Stimulant medicines. These are the most common medicines used for adult ADHD. They affect certain chemicals in the brain (neurotransmitters) and improve your ability to control your symptoms.  A non-stimulant medicine for adult ADHD (atomoxetine). This medicine increases a neurotransmitter called norepinephrine. It may take weeks to months to see effects from this medicine. Counseling and behavioral management are also important for treating ADHD. Counseling is often used along with medicine. Your health care provider may suggest:  Cognitive behavioral therapy (CBT). This type of therapy teaches you to replace negative thoughts and actions with positive thoughts and actions. When used as part of ADHD treatment, this therapy may also include: ? Coping strategies for organization, time management, impulse control, and stress reduction. ? Mindfulness and meditation training.  Behavioral management. You may work with a [facility] [facility] manager who is specially trained to help people with ADHD manage and organize activities and function more effectively. Follow these instructions at home: Medicines   Take  over-the-counter and prescription medicines only as told by your health care provider.  Talk with your health care provider about the possible side effects of your medicines and how to manage them. Lifestyle   Do not use drugs.  Do not drink alcohol if: ? Your health care provider tells you not to drink. ? You are pregnant, may be pregnant, or are planning to become pregnant.  If you drink alcohol: ? Limit how much you use to:  0-1 drink a day for women.  0-2 drinks a day for men. ? Be aware of how much alcohol is in  your drink. In the [place] one drink equals one 12 oz bottle of beer (355 mL), one 5 oz glass of wine (148 mL), or one 1 oz glass of hard liquor (44 mL).  Get enough sleep.  Eat a healthy diet.  Exercise regularly. Exercise can help to reduce stress and anxiety. General instructions  Learn as much as you can about adult ADHD, and work closely with your health care providers to find the treatments that work best for you.  Follow the same schedule each day.  Use reminder devices like notes, calendars, and phone apps to stay on time and organized.  Keep all follow-up visits as told by your health care provider and therapist. This is important. Where to find more information A health care provider may be able to recommend resources that are available online or over the phone. You could start with:  Attention Deficit Disorder Association (ADDA): PubAddiction.co.nz  National Institute of Mental Health [facility]): https://carter.com/ Contact a health care provider if:  Your symptoms continue to cause problems.  You have side effects from your medicine, such as: ? Repeated muscle twitches, coughing, or speech outbursts. ? Sleep problems. ? Loss of appetite. ? Dizziness. ? Unusually fast heartbeat. ? Stomach pains. ? Headaches.  You are struggling with anxiety, depression, or substance abuse. Get help right away if you:  Have a severe reaction to a medicine. If you ever feel like you may hurt yourself or others, or have thoughts about taking your own life, get help right away. You can go to the nearest emergency department or call:  Your local emergency services (911 in the [place]  A suicide crisis helpline, such as the [facility] at 828-182-1963. This is open 24 hours a day. Summary  ADHD is a mental health disorder that starts during childhood (neurodevelopmental disorder) and often continues into the adult years.  The exact cause of ADHD is not known. Most  experts believe genetics and environmental factors contribute to ADHD.  There is no cure for ADHD, but treatment with medicine, cognitive behavioral therapy, or behavioral management can help you manage your condition. This information is not intended to replace advice given to you by your health care provider. Make sure you discuss any questions you have with your health care provider. Document Revised: 04/10/2019 Document Reviewed: 04/10/2019 Elsevier Patient Education  [place].

## 2019-12-12 ENCOUNTER — Telehealth: Payer: Self-pay | Admitting: *Deleted

## 2019-12-12 NOTE — Telephone Encounter (Signed)
Per CMM: Approvedtoday  Request Reference Number: [id number]. AMPHET/DEXTR TAB 10MG  is approved through 12/11/2020. For further questions, call [phone].  Pt aware.

## 2019-12-12 NOTE — Telephone Encounter (Signed)
Generic Adderall PA completed on CMM. Awaiting [facility] determination. Key: [id number].

## 2019-12-21 ENCOUNTER — Other Ambulatory Visit: Payer: Self-pay | Admitting: Neurology

## 2020-01-12 ENCOUNTER — Other Ambulatory Visit: Payer: Self-pay

## 2020-01-12 ENCOUNTER — Ambulatory Visit
Admission: RE | Admit: 2020-01-12 | Discharge: 2020-01-12 | Disposition: A | Discharge status: Home / Self Care | Payer: BC Managed Care – PPO | Source: Ambulatory Visit | Attending: Certified Nurse Midwife | Admitting: Certified Nurse Midwife

## 2020-01-12 DIAGNOSIS — Z1231 Encounter for screening mammogram for malignant neoplasm of breast: Secondary | ICD-10-CM | POA: Diagnosis not present

## 2020-01-16 NOTE — Progress Notes (Signed)
Mammogram reviewed negative. Birads 1 B density Repeat yearly

## 2020-02-02 DIAGNOSIS — M7061 Trochanteric bursitis, right hip: Secondary | ICD-10-CM | POA: Diagnosis not present

## 2020-02-02 DIAGNOSIS — M5126 Other intervertebral disc displacement, lumbar region: Secondary | ICD-10-CM | POA: Diagnosis not present

## 2020-02-02 DIAGNOSIS — M4302 Spondylolysis, cervical region: Secondary | ICD-10-CM | POA: Diagnosis not present

## 2020-02-05 ENCOUNTER — Other Ambulatory Visit: Payer: Self-pay | Admitting: Neurology

## 2020-02-05 DIAGNOSIS — R300 Dysuria: Secondary | ICD-10-CM | POA: Diagnosis not present

## 2020-02-05 NOTE — Addendum Note (Signed)
Addended by: Inis Sizer D on: 02/05/2020 01:21 PM   Modules accepted: Orders

## 2020-02-06 ENCOUNTER — Other Ambulatory Visit: Payer: Self-pay | Admitting: Neurology

## 2020-02-06 MED ORDER — NITROFURANTOIN MONOHYD MACRO 100 MG PO CAPS: 100.0000 mg | ORAL_CAPSULE | Freq: Two times a day (BID) | ORAL | 0 refills | Status: DC

## 2020-02-07 ENCOUNTER — Encounter (HOSPITAL_COMMUNITY): Payer: Self-pay | Admitting: Emergency Medicine

## 2020-02-07 ENCOUNTER — Other Ambulatory Visit: Payer: Self-pay

## 2020-02-07 ENCOUNTER — Emergency Department (HOSPITAL_COMMUNITY)
Admission: EM | Admit: 2020-02-07 | Discharge: 2020-02-07 | Disposition: A | Discharge status: Home / Self Care | Payer: BC Managed Care – PPO | Attending: Emergency Medicine | Admitting: Emergency Medicine

## 2020-02-07 ENCOUNTER — Emergency Department (HOSPITAL_COMMUNITY): Payer: BC Managed Care – PPO

## 2020-02-07 DIAGNOSIS — R109 Unspecified abdominal pain: Secondary | ICD-10-CM | POA: Diagnosis not present

## 2020-02-07 DIAGNOSIS — R1011 Right upper quadrant pain: Secondary | ICD-10-CM | POA: Insufficient documentation

## 2020-02-07 DIAGNOSIS — N390 Urinary tract infection, site not specified: Secondary | ICD-10-CM | POA: Diagnosis not present

## 2020-02-07 DIAGNOSIS — N1330 Unspecified hydronephrosis: Secondary | ICD-10-CM | POA: Diagnosis not present

## 2020-02-07 LAB — URINALYSIS, ROUTINE W REFLEX MICROSCOPIC
Bilirubin, UA: NEGATIVE
Glucose, UA: NEGATIVE
Ketones, UA: NEGATIVE
Nitrite, UA: POSITIVE — AB
Protein,UA: NEGATIVE
Specific Gravity, UA: 1.02 (ref 1.005–1.030)
Urobilinogen, Ur: 1 mg/dL (ref 0.2–1.0)
pH, UA: 5 (ref 5.0–7.5)

## 2020-02-07 LAB — CBC WITH DIFFERENTIAL/PLATELET
Abs Immature Granulocytes: 0.01 10*3/uL (ref 0.00–0.07)
Basophils Absolute: 0 10*3/uL (ref 0.0–0.1)
Basophils Relative: 1 %
Eosinophils Absolute: 0.1 10*3/uL (ref 0.0–0.5)
Eosinophils Relative: 2 %
HCT: 37.9 % (ref 36.0–46.0)
Hemoglobin: 12.1 g/dL (ref 12.0–15.0)
Immature Granulocytes: 0 %
Lymphocytes Relative: 38 %
Lymphs Abs: 1.9 10*3/uL (ref 0.7–4.0)
MCH: 27.4 pg (ref 26.0–34.0)
MCHC: 31.9 g/dL (ref 30.0–36.0)
MCV: 85.9 fL (ref 80.0–100.0)
Monocytes Absolute: 0.4 10*3/uL (ref 0.1–1.0)
Monocytes Relative: 8 %
Neutro Abs: 2.5 10*3/uL (ref 1.7–7.7)
Neutrophils Relative %: 51 %
Platelets: 225 10*3/uL (ref 150–400)
RBC: 4.41 MIL/uL (ref 3.87–5.11)
RDW: 12.9 % (ref 11.5–15.5)
WBC: 4.9 10*3/uL (ref 4.0–10.5)
nRBC: 0 % (ref 0.0–0.2)

## 2020-02-07 LAB — I-STAT BETA HCG BLOOD, ED (MC, WL, AP ONLY): I-stat hCG, quantitative: <5 m[IU]/mL (ref <5)

## 2020-02-07 LAB — MICROSCOPIC EXAMINATION: Casts: NONE SEEN /lpf

## 2020-02-07 LAB — COMPREHENSIVE METABOLIC PANEL
ALT: 20 U/L (ref 0–44)
AST: 17 U/L (ref 15–41)
Albumin: 3.7 g/dL (ref 3.5–5.0)
Alkaline Phosphatase: 61 U/L (ref 38–126)
Anion gap: 8 (ref 5–15)
BUN: 10 mg/dL (ref 6–20)
CO2: 28 mmol/L (ref 22–32)
Calcium: 8.9 mg/dL (ref 8.9–10.3)
Chloride: 103 mmol/L (ref 98–111)
Creatinine, Ser: 0.65 mg/dL (ref 0.44–1.00)
GFR calc Af Amer: >60 mL/min (ref >60)
GFR calc non Af Amer: >60 mL/min (ref >60)
Glucose, Bld: 96 mg/dL (ref 70–99)
Potassium: 3.6 mmol/L (ref 3.5–5.1)
Sodium: 139 mmol/L (ref 135–145)
Total Bilirubin: 0.2 mg/dL — ABNORMAL LOW (ref 0.3–1.2)
Total Protein: 7.8 g/dL (ref 6.5–8.1)

## 2020-02-07 LAB — LIPASE, BLOOD: Lipase: 31 U/L (ref 11–51)

## 2020-02-07 LAB — URINE CULTURE

## 2020-02-07 MED ORDER — SODIUM CHLORIDE 0.9 % IV BOLUS
1000.0000 mL | Freq: Once | INTRAVENOUS | Status: AC
  Administered 2020-02-07: 1000 mL via INTRAVENOUS

## 2020-02-07 MED ORDER — ONDANSETRON HCL 4 MG PO TABS: 4.0000 mg | ORAL_TABLET | Freq: Four times a day (QID) | ORAL | 0 refills | Status: DC

## 2020-02-07 MED ORDER — KETOROLAC TROMETHAMINE 15 MG/ML IJ SOLN
15.0000 mg | Freq: Once | INTRAMUSCULAR | Status: AC
  Administered 2020-02-07: 15 mg via INTRAVENOUS
  Filled 2020-02-07: qty 1

## 2020-02-07 NOTE — Discharge Instructions (Signed)
Please return for any problem.  Follow-up with your regular care provider as instructed.  Continue course of Macrobid as previously prescribed.  Drink plenty of fluids.  Use Zofran as required for nausea.
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
## 2020-02-07 NOTE — ED Triage Notes (Signed)
Pt c/o flank pains for couple weeks. Reports had urinary problems that started on Saturday. Reports got prescribed the Macrobid yesterday for a UTI. Reports pains are just really bad and took Ibuprofen and cream pain medication last night to be able to sleep.

## 2020-02-09 DIAGNOSIS — M7061 Trochanteric bursitis, right hip: Secondary | ICD-10-CM | POA: Diagnosis not present

## 2020-02-09 DIAGNOSIS — Q7649 Other congenital malformations of spine, not associated with scoliosis: Secondary | ICD-10-CM | POA: Diagnosis not present

## 2020-02-14 ENCOUNTER — Other Ambulatory Visit: Payer: Self-pay | Admitting: Neurology

## 2020-02-14 DIAGNOSIS — R300 Dysuria: Secondary | ICD-10-CM

## 2020-02-14 NOTE — Addendum Note (Signed)
Addended by: Inis Sizer D on: 02/14/2020 03:15 PM   Modules accepted: Orders

## 2020-02-15 LAB — URINALYSIS, ROUTINE W REFLEX MICROSCOPIC
Bilirubin, UA: NEGATIVE
Glucose, UA: NEGATIVE
Ketones, UA: NEGATIVE
Leukocytes,UA: NEGATIVE
Nitrite, UA: NEGATIVE
Protein,UA: NEGATIVE
RBC, UA: NEGATIVE
Specific Gravity, UA: 1.009 (ref 1.005–1.030)
Urobilinogen, Ur: 0.2 mg/dL (ref 0.2–1.0)
pH, UA: 5.5 (ref 5.0–7.5)

## 2020-02-16 ENCOUNTER — Encounter: Payer: Self-pay | Admitting: Certified Nurse Midwife

## 2020-02-16 DIAGNOSIS — M5126 Other intervertebral disc displacement, lumbar region: Secondary | ICD-10-CM | POA: Diagnosis not present

## 2020-02-16 DIAGNOSIS — M7061 Trochanteric bursitis, right hip: Secondary | ICD-10-CM | POA: Diagnosis not present

## 2020-02-16 DIAGNOSIS — M4302 Spondylolysis, cervical region: Secondary | ICD-10-CM | POA: Diagnosis not present

## 2020-03-13 ENCOUNTER — Other Ambulatory Visit: Payer: Self-pay | Admitting: Neurology

## 2020-03-13 ENCOUNTER — Other Ambulatory Visit: Payer: Self-pay

## 2020-03-13 DIAGNOSIS — Z1152 Encounter for screening for COVID-19: Secondary | ICD-10-CM | POA: Diagnosis not present

## 2020-03-13 NOTE — Addendum Note (Signed)
Addended by: Inis Sizer D on: 03/13/2020 04:55 PM   Modules accepted: Orders

## 2020-03-14 ENCOUNTER — Ambulatory Visit: Payer: BC Managed Care – PPO | Admitting: Internal Medicine

## 2020-03-14 ENCOUNTER — Encounter: Payer: Self-pay | Admitting: Internal Medicine

## 2020-03-14 VITALS — BP 102/68 | HR 80 | Temp 98.0°F | Ht 60.0 in | Wt 202.2 lb

## 2020-03-14 DIAGNOSIS — R0602 Shortness of breath: Secondary | ICD-10-CM | POA: Diagnosis not present

## 2020-03-14 DIAGNOSIS — Z124 Encounter for screening for malignant neoplasm of cervix: Secondary | ICD-10-CM | POA: Diagnosis not present

## 2020-03-14 DIAGNOSIS — E669 Obesity, unspecified: Secondary | ICD-10-CM | POA: Diagnosis not present

## 2020-03-14 DIAGNOSIS — Z20828 Contact with and (suspected) exposure to other viral communicable diseases: Secondary | ICD-10-CM | POA: Diagnosis not present

## 2020-03-14 DIAGNOSIS — Z03818 Encounter for observation for suspected exposure to other biological agents ruled out: Secondary | ICD-10-CM | POA: Diagnosis not present

## 2020-03-14 LAB — SAR COV2 SEROLOGY (COVID19)AB(IGG),IA: DiaSorin SARS-CoV-2 Ab, IgG: NEGATIVE

## 2020-03-14 NOTE — Patient Instructions (Addendum)
-  Nice seeing you today!!  -Will send you for an echocardiogram.  -Schedule follow up in 3-4 months for your physical. Please come in fasting that day.

## 2020-03-14 NOTE — Progress Notes (Signed)
New Patient Office Visit     This visit occurred during the SARS-CoV-2 public health emergency.  Safety protocols were in place, including screening questions prior to the visit, additional usage of staff PPE, and extensive cleaning of exam room while observing appropriate contact time as indicated for disinfecting solutions.    CC/Reason for Visit: Establish care, discuss shortness of breath Previous PCP: None Last Visit: Unknown  HPI: Norma Soto is a 44 y.o. female who is coming in today for the above mentioned reasons. Past Medical History is significant for: Previously diagnosed history of ADD for which she is not taking medications, chronic shortness of breath and obesity.  She has gone through an extensive work-up with pulmonary for shortness of breath.  Chest x-ray was normal, PFTs were normal.  It was thought that her body habitus may be at play.  Shortness of breath does not necessarily occur with exertion.  She does not ever have chest pain.  She works as a Char[employer] fundraiser.  She has 2 grown children ages 65 and 32, she has no known drug allergies, her past surgical history significant for a hysterectomy and a C-section, she does not smoke, she does not drink, she does not do drugs.  She does not take any medications other than as needed albuterol or NSAIDs.  Her family history significant for a mother who is deceased from multiple myeloma but she had coronary artery disease, father died from a fatal MI at age 5 he also has hypertension, her brother had a heart transplant in 2012.   Past Medical/Surgical History: Past Medical History:  Diagnosis Date  . Abnormal Pap smear of cervix    many yrs ago, just repeat done  . ADHD (attention deficit hyperactivity disorder), inattentive type   . Dysmenorrhea   . Fibroid   . Headache   . Menorrhagia     Past Surgical History:  Procedure Laterality Date  . ABDOMINAL HYSTERECTOMY    . BREAST BIOPSY Right 10/2018   x2  .  CESAREAN SECTION      Social History:  reports that she has never smoked. She has never used smokeless tobacco. She reports that she does not drink alcohol or use drugs.  Allergies: No Known Allergies  Family History:  Family History  Problem Relation Age of Onset  . Multiple myeloma Mother   . Hypertension Father   . Stroke Father   . Heart attack Father   . Heart disease Father   . Other Brother      Current Outpatient Medications:  .  albuterol (VENTOLIN HFA) 108 (90 Base) MCG/ACT inhaler, Inhale 2 puffs into the lungs every 6 (six) hours as needed for wheezing or shortness of breath., Disp: 18 g, Rfl: 6 .  amitriptyline (ELAVIL) 10 MG tablet, TAKE 1-2 TABLETS (10-20 MG TOTAL) BY MOUTH AT BEDTIME., Disp: 180 tablet, Rfl: 3 .  ibuprofen (ADVIL) 800 MG tablet, Take 1 tablet (800 mg total) by mouth every 8 (eight) hours as needed for fever or headache., Disp: 90 tablet, Rfl: 2 .  lidocaine (LIDODERM) 5 %, Place 1 patch onto the skin daily. Remove & Discard patch within 12 hours or as directed by MD (Patient taking differently: Place 1 patch onto the skin daily as needed (pain). Remove & Discard patch within 12 hours or as directed by MD), Disp: 30 patch, Rfl: 4 .  amphetamine-dextroamphetamine (ADDERALL) 10 MG tablet, Take 1 tablet (10 mg total) by mouth 2 (two) times daily with  a meal. (Patient not taking: Reported on 03/14/2020), Disp: 60 tablet, Rfl: 0  Review of Systems:  Constitutional: Denies fever, chills, diaphoresis, appetite change and fatigue.  HEENT: Denies photophobia, eye pain, redness, hearing loss, ear pain, congestion, sore throat, rhinorrhea, sneezing, mouth sores, trouble swallowing, neck pain, neck stiffness and tinnitus.   Respiratory: Denies , cough, chest tightness,  and wheezing.   Cardiovascular: Denies chest pain, palpitations and leg swelling.  Gastrointestinal: Denies nausea, vomiting, abdominal pain, diarrhea, constipation, blood in stool and abdominal  distention.  Genitourinary: Denies dysuria, urgency, frequency, hematuria, flank pain and difficulty urinating.  Endocrine: Denies: hot or cold intolerance, sweats, changes in hair or nails, polyuria, polydipsia. Musculoskeletal: Denies myalgias, back pain, joint swelling, arthralgias and gait problem.  Skin: Denies pallor, rash and wound.  Neurological: Denies dizziness, seizures, syncope, weakness, light-headedness, numbness and headaches.  Hematological: Denies adenopathy. Easy bruising, personal or family bleeding history  Psychiatric/Behavioral: Denies suicidal ideation, mood changes, confusion, nervousness, sleep disturbance and agitation    Physical Exam: Vitals:   03/14/20 1515  BP: 102/68  Pulse: 80  Temp: 98 F (36.7 C)  TempSrc: Temporal  SpO2: 97%  Weight: 202 lb 3.2 oz (91.7 kg)  Height: 5' (1.524 m)   Body mass index is 39.49 kg/m.  Constitutional: NAD, calm, comfortable Eyes: PERRL, lids and conjunctivae normal ENMT: Mucous membranes are moist.  Respiratory: clear to auscultation bilaterally, no wheezing, no crackles. Normal respiratory effort. No accessory muscle use.  Cardiovascular: Regular rate and rhythm, no murmurs / rubs / gallops. No extremity edema. Neurologic: Grossly intact and nonfocal Psychiatric: Normal judgment and insight. Alert and oriented x 3. Normal mood.    Impression and Plan:  SOB (shortness of breath)  -She has been seen by pulmonary with work-up as above, her issues are not deemed to be pulmonary in nature. -With her family history, I think it is prudent to check a basic cardiac work-up including labs to further re-stratify, EKG and echo. -She will return fasting for physical we will order labs and EKG at that time, echo has been requested today. -I also agree that her body habitus might be playing a role, we have discussed weight loss.  Cervical cancer screening  - Plan: Ambulatory referral to Gynecology  Obesity (BMI 35.0-39.9  without comorbidity) -Discussed healthy lifestyle, including increased physical activity and better food choices to promote weight loss. -We will continue to address this at subsequent visits.     Patient Instructions  -Nice seeing you today!!  -Will send you for an echocardiogram.  -Schedule follow up in 3-4 months for your physical. Please come in fasting that day.     Lelon Frohlich, MD [facility]

## 2020-03-21 DIAGNOSIS — Z1152 Encounter for screening for COVID-19: Secondary | ICD-10-CM | POA: Diagnosis not present

## 2020-03-21 DIAGNOSIS — J019 Acute sinusitis, unspecified: Secondary | ICD-10-CM | POA: Diagnosis not present

## 2020-04-04 ENCOUNTER — Other Ambulatory Visit: Payer: Self-pay

## 2020-04-05 ENCOUNTER — Encounter: Payer: Self-pay | Admitting: Obstetrics and Gynecology

## 2020-04-05 ENCOUNTER — Ambulatory Visit: Payer: BC Managed Care – PPO | Admitting: Obstetrics and Gynecology

## 2020-04-05 VITALS — BP 124/78 | Ht 63.0 in | Wt 200.0 lb

## 2020-04-05 DIAGNOSIS — Z1329 Encounter for screening for other suspected endocrine disorder: Secondary | ICD-10-CM | POA: Diagnosis not present

## 2020-04-05 DIAGNOSIS — Z113 Encounter for screening for infections with a predominantly sexual mode of transmission: Secondary | ICD-10-CM | POA: Diagnosis not present

## 2020-04-05 DIAGNOSIS — Z01419 Encounter for gynecological examination (general) (routine) without abnormal findings: Secondary | ICD-10-CM

## 2020-04-05 DIAGNOSIS — E7800 Pure hypercholesterolemia, unspecified: Secondary | ICD-10-CM | POA: Diagnosis not present

## 2020-04-05 NOTE — Progress Notes (Signed)
   [facility] [birth year] [id number]  SUBJECTIVE:  44 y.o. [id number] female new patient here for an annual routine gynecologic exam and Pap smear. She has no gynecologic concerns. Current Outpatient Medications  Medication Sig Dispense Refill  . albuterol (VENTOLIN HFA) 108 (90 Base) MCG/ACT inhaler Inhale 2 puffs into the lungs every 6 (six) hours as needed for wheezing or shortness of breath. 18 g 6  . amitriptyline (ELAVIL) 10 MG tablet TAKE 1-2 TABLETS (10-20 MG TOTAL) BY MOUTH AT BEDTIME. 180 tablet 3  . amphetamine-dextroamphetamine (ADDERALL) 10 MG tablet Take 1 tablet (10 mg total) by mouth 2 (two) times daily with a meal. 60 tablet 0  . ibuprofen (ADVIL) 800 MG tablet Take 1 tablet (800 mg total) by mouth every 8 (eight) hours as needed for fever or headache. 90 tablet 2  . lidocaine (LIDODERM) 5 % Place 1 patch onto the skin daily. Remove & Discard patch within 12 hours or as directed by MD (Patient taking differently: Place 1 patch onto the skin daily as needed (pain). Remove & Discard patch within 12 hours or as directed by MD) 30 patch 4   No current facility-administered medications for this visit.   Allergies: Patient has no known allergies.  No LMP recorded. Patient has had a hysterectomy.  Past medical history,surgical history, problem list, medications, allergies, family history and social history were all reviewed and documented as reviewed in the EPIC chart.  ROS:  Feeling well. No dyspnea or chest pain on exertion.  No abdominal pain, change in bowel habits, black or bloody stools.  No urinary tract symptoms. GYN ROS: no abnormal bleeding, pelvic pain or discharge, no breast pain or new or enlarging lumps on self exam. No neurological complaints.   OBJECTIVE:  BP 124/78   Ht 5\' 3"  (1.6 m)   Wt 200 lb (90.7 kg)   BMI 35.43 kg/m  The patient appears well, alert, oriented x 3, in no distress. ENT normal.  Neck supple. No cervical or supraclavicular adenopathy or  thyromegaly.  Lungs are clear, good air entry, no wheezes, rhonchi or rales. S1 and S2 normal, no murmurs, regular rate and rhythm.  Abdomen soft without tenderness, guarding, mass or organomegaly.  Neurological is normal, no focal findings.  BREAST EXAM: breasts pendulous, appear normal, no suspicious masses, no skin or nipple changes or axillary nodes  PELVIC EXAM: VULVA: normal appearing vulva with no masses, tenderness or lesions, VAGINA: normal appearing vagina with normal color and discharge, no lesions, CERVIX: long Peterson speculum needed to see vaginal cuff, cervix surgically absent, UTERUS: surgically absent, vaginal cuff normal, ADNEXA: no masses, PAP: Pap smear done today, thin-prep method  Chaperone: Caryn Bee present during the examination  ASSESSMENT:  44 y.o. [id number] here for annual gynecologic exam  PLAN:   1. No hormonal or menstrual concerns. Prior TAH for fibroids and heavy periods. No hot flashes or night sweats or vaginal bleeding. 2. Pap smear of vaginal cuff collected today.  She does indicate a history of abnormal Pap smears.  3. Mammogram 01/2020.  Breast exam normal.  Continue annual mammography. 4. Requests STD screening.  Checking GC/committee off of the Pap smear, hepatitis B and C screen, RPR, HIV 5. Health maintenance.  She will proceed to lab today for routine screening blood work (lipids, CBC, CMP, TSH) in addition to requested STD screening.  Return annually or sooner, prn.  Joseph Pierini MD 04/05/20

## 2020-04-06 LAB — CBC
Hematocrit: 36.8 % (ref 34.0–46.6)
Hemoglobin: 12.4 g/dL (ref 11.1–15.9)
MCH: 27.9 pg (ref 26.6–33.0)
MCHC: 33.7 g/dL (ref 31.5–35.7)
MCV: 83 fL (ref 79–97)
Platelets: 214 10*3/uL (ref 150–450)
RBC: 4.44 x10E6/uL (ref 3.77–5.28)
RDW: 12.4 % (ref 11.7–15.4)
WBC: 3.5 10*3/uL (ref 3.4–10.8)

## 2020-04-06 LAB — LIPID PANEL
Chol/HDL Ratio: 3.1 ratio (ref 0.0–4.4)
Cholesterol, Total: 165 mg/dL (ref 100–199)
HDL: 53 mg/dL (ref >39)
LDL Chol Calc (NIH): 102 mg/dL — ABNORMAL HIGH (ref 0–99)
Triglycerides: 47 mg/dL (ref 0–149)
VLDL Cholesterol Cal: 10 mg/dL (ref 5–40)

## 2020-04-06 LAB — COMPREHENSIVE METABOLIC PANEL WITH GFR
ALT: 14 [IU]/L (ref 0–32)
AST: 14 [IU]/L (ref 0–40)
Albumin/Globulin Ratio: 1.3 (ref 1.2–2.2)
Albumin: 4 g/dL (ref 3.8–4.8)
Alkaline Phosphatase: 68 [IU]/L (ref 39–117)
BUN/Creatinine Ratio: 10 (ref 9–23)
BUN: 7 mg/dL (ref 6–24)
Bilirubin Total: 0.2 mg/dL (ref 0.0–1.2)
CO2: 22 mmol/L (ref 20–29)
Calcium: 9.5 mg/dL (ref 8.7–10.2)
Chloride: 105 mmol/L (ref 96–106)
Creatinine, Ser: 0.71 mg/dL (ref 0.57–1.00)
GFR calc Af Amer: 121 mL/min/{1.73_m2}
GFR calc non Af Amer: 105 mL/min/{1.73_m2}
Globulin, Total: 3.1 g/dL (ref 1.5–4.5)
Glucose: 87 mg/dL (ref 65–99)
Potassium: 3.9 mmol/L (ref 3.5–5.2)
Sodium: 141 mmol/L (ref 134–144)
Total Protein: 7.1 g/dL (ref 6.0–8.5)

## 2020-04-06 LAB — TSH: TSH: 1.35 u[IU]/mL (ref 0.450–4.500)

## 2020-04-06 LAB — HEPATITIS C ANTIBODY: Hep C Virus Ab: <0.1 {s_co_ratio} (ref 0.0–0.9)

## 2020-04-06 LAB — HIV ANTIBODY (ROUTINE TESTING W REFLEX): HIV Screen 4th Generation wRfx: NONREACTIVE

## 2020-04-06 LAB — SYPHILIS: RPR W/REFLEX TO RPR TITER AND TREPONEMAL ANTIBODIES, TRADITIONAL SCREENING AND DIAGNOSIS ALGORITHM: RPR Ser Ql: NONREACTIVE

## 2020-04-06 LAB — HEPATITIS B SURFACE ANTIGEN: Hepatitis B Surface Ag: NEGATIVE

## 2020-04-16 LAB — IGP, CTNG, RFX APTIMA HPV ASCU
Chlamydia, Nuc. Acid Amp: NEGATIVE
Gonococcus by Nucleic Acid Amp: NEGATIVE
PAP Smear Comment: 0

## 2020-04-18 ENCOUNTER — Other Ambulatory Visit: Payer: Self-pay

## 2020-04-19 ENCOUNTER — Encounter: Payer: Self-pay | Admitting: Internal Medicine

## 2020-04-19 ENCOUNTER — Ambulatory Visit (INDEPENDENT_AMBULATORY_CARE_PROVIDER_SITE_OTHER): Payer: BC Managed Care – PPO | Admitting: Internal Medicine

## 2020-04-19 VITALS — BP 110/80 | HR 72 | Temp 97.9°F | Ht 63.0 in | Wt 204.3 lb

## 2020-04-19 DIAGNOSIS — F900 Attention-deficit hyperactivity disorder, predominantly inattentive type: Secondary | ICD-10-CM | POA: Diagnosis not present

## 2020-04-19 DIAGNOSIS — Z0000 Encounter for general adult medical examination without abnormal findings: Secondary | ICD-10-CM | POA: Diagnosis not present

## 2020-04-19 DIAGNOSIS — E669 Obesity, unspecified: Secondary | ICD-10-CM

## 2020-04-19 DIAGNOSIS — Z23 Encounter for immunization: Secondary | ICD-10-CM

## 2020-04-19 NOTE — Addendum Note (Signed)
Addended by: Elmer Picker on: 04/19/2020 10:08 AM   Modules accepted: Orders

## 2020-04-19 NOTE — Patient Instructions (Signed)
-Nice seeing you today!!  -Lab work today; will notify you once results are available.  -Tetanus booster today.  -See you back in 3 months for weight management. See below for healthy eating ideas.   Mediterranean Diet A Mediterranean diet refers to food and lifestyle choices that are based on the traditions of countries located on the [employer]. This way of eating has been shown to help prevent certain conditions and improve outcomes for people who have chronic diseases, like kidney disease and heart disease. What are tips for following this plan? Lifestyle  Cook and eat meals together with your family, when possible.  Drink enough fluid to keep your urine clear or pale yellow.  Be physically active every day. This includes: ? Aerobic exercise like running or swimming. ? Leisure activities like gardening, walking, or housework.  Get 7-8 hours of sleep each night.  If recommended by your health care provider, drink red wine in moderation. This means 1 glass a day for nonpregnant women and 2 glasses a day for men. A glass of wine equals 5 oz (150 mL). Reading food labels   Check the serving size of packaged foods. For foods such as rice and pasta, the serving size refers to the amount of cooked product, not dry.  Check the total fat in packaged foods. Avoid foods that have saturated fat or trans fats.  Check the ingredients list for added sugars, such as corn syrup. Shopping  At the grocery store, buy most of your food from the areas near the walls of the store. This includes: ? Fresh fruits and vegetables (produce). ? Grains, beans, nuts, and seeds. Some of these may be available in unpackaged forms or large amounts (in bulk). ? Fresh seafood. ? Poultry and eggs. ? Low-fat dairy products.  Buy whole ingredients instead of prepackaged foods.  Buy fresh fruits and vegetables in-season from local farmers markets.  Buy frozen fruits and vegetables in resealable  bags.  If you do not have access to quality fresh seafood, buy precooked frozen shrimp or canned fish, such as tuna, salmon, or sardines.  Buy small amounts of raw or cooked vegetables, salads, or olives from the deli or salad bar at your store.  Stock your pantry so you always have certain foods on hand, such as olive oil, canned tuna, canned tomatoes, rice, pasta, and beans. Cooking  Cook foods with extra-virgin olive oil instead of using butter or other vegetable oils.  Have meat as a side dish, and have vegetables or grains as your main dish. This means having meat in small portions or adding small amounts of meat to foods like pasta or stew.  Use beans or vegetables instead of meat in common dishes like chili or lasagna.  Experiment with different cooking methods. Try roasting or broiling vegetables instead of steaming or sauteing them.  Add frozen vegetables to soups, stews, pasta, or rice.  Add nuts or seeds for added healthy fat at each meal. You can add these to yogurt, salads, or vegetable dishes.  Marinate fish or vegetables using olive oil, lemon juice, garlic, and fresh herbs. Meal planning   Plan to eat 1 vegetarian meal one day each week. Try to work up to 2 vegetarian meals, if possible.  Eat seafood 2 or more times a week.  Have healthy snacks readily available, such as: ? Vegetable sticks with hummus. ? [place] yogurt. ? Fruit and nut trail mix.  Eat balanced meals throughout the week. This includes: ? Fruit:  2-3 servings a day ? Vegetables: 4-5 servings a day ? Low-fat dairy: 2 servings a day ? Fish, poultry, or lean meat: 1 serving a day ? Beans and legumes: 2 or more servings a week ? Nuts and seeds: 1-2 servings a day ? Whole grains: 6-8 servings a day ? Extra-virgin olive oil: 3-4 servings a day  Limit red meat and sweets to only a few servings a month What are my food choices?  Mediterranean diet ? Recommended  Grains: Whole-grain pasta. Brown  rice. Bulgar wheat. Polenta. Couscous. Whole-wheat bread. [place].  Vegetables: Artichokes. Beets. Broccoli. Cabbage. Carrots. Eggplant. Green beans. Chard. Kale. Spinach. Onions. Leeks. Peas. Squash. Tomatoes. Peppers. Radishes.  Fruits: Apples. Apricots. Avocado. Berries. Bananas. Cherries. Dates. Figs. Grapes. Lemons. Melon. Oranges. Peaches. Plums. Pomegranate.  Meats and other protein foods: Beans. Almonds. Sunflower seeds. Pine nuts. Peanuts. [place]. Salmon. Scallops. Shrimp. [place]. Tilapia. Clams. Oysters. Eggs.  Dairy: Low-fat milk. Cheese. Greek yogurt.  Beverages: Water. Red wine. Herbal tea.  Fats and oils: Extra virgin olive oil. Avocado oil. Grape seed oil.  Sweets and desserts: [place] yogurt with honey. Baked apples. Poached pears. Trail mix.  Seasoning and other foods: Basil. Cilantro. Coriander. Cumin. Mint. Parsley. Sage. Rosemary. Tarragon. Garlic. Oregano. Thyme. Pepper. Balsalmic vinegar. Tahini. Hummus. Tomato sauce. Olives. Mushrooms. ? Limit these  Grains: Prepackaged pasta or rice dishes. Prepackaged cereal with added sugar.  Vegetables: Deep fried potatoes (french fries).  Fruits: Fruit canned in syrup.  Meats and other protein foods: Beef. Pork. Lamb. Poultry with skin. Hot dogs. Berniece Salines.  Dairy: Ice cream. Sour cream. Whole milk.  Beverages: Juice. Sugar-sweetened soft drinks. Beer. Liquor and spirits.  Fats and oils: Butter. Canola oil. Vegetable oil. Beef fat (tallow). Lard.  Sweets and desserts: Cookies. Cakes. Pies. Candy.  Seasoning and other foods: Mayonnaise. Premade sauces and marinades. The items listed may not be a complete list. Talk with your dietitian about what dietary choices are right for you. Summary  The Mediterranean diet includes both food and lifestyle choices.  Eat a variety of fresh fruits and vegetables, beans, nuts, seeds, and whole grains.  Limit the amount of red meat and sweets that you eat.  Talk with your health  care provider about whether it is safe for you to drink red wine in moderation. This means 1 glass a day for nonpregnant women and 2 glasses a day for men. A glass of wine equals 5 oz (150 mL). This information is not intended to replace advice given to you by your health care provider. Make sure you discuss any questions you have with your health care provider. Document Revised: 07/16/2016 Document Reviewed: 07/09/2016 Elsevier Patient Education  [place].

## 2020-04-19 NOTE — Addendum Note (Signed)
Addended by: Westley Hummer B on: 04/19/2020 11:31 AM   Modules accepted: Orders

## 2020-04-19 NOTE — Addendum Note (Signed)
Addended by: Elmer Picker on: 04/19/2020 10:03 AM   Modules accepted: Orders

## 2020-04-19 NOTE — Progress Notes (Signed)
Established Patient Office Visit     This visit occurred during the SARS-CoV-2 public health emergency.  Safety protocols were in place, including screening questions prior to the visit, additional usage of staff PPE, and extensive cleaning of exam room while observing appropriate contact time as indicated for disinfecting solutions.    CC/Reason for Visit: Annual preventive exam   HPI: Norma Soto is a 44 y.o. female who is coming in today for the above mentioned reasons. Past Medical History is significant for: ADHD and obesity.  She has no complaints today.  Has been doing well.  She would like to discuss with her treatment.  She is due for Covid vaccination and Tdap.  She had a mammogram in February.   Past Medical/Surgical History: Past Medical History:  Diagnosis Date  . Abnormal Pap smear of cervix    many yrs ago, just repeat done  . ADHD (attention deficit hyperactivity disorder), inattentive type   . Dysmenorrhea   . Fibroid   . Headache   . Menorrhagia     Past Surgical History:  Procedure Laterality Date  . ABDOMINAL HYSTERECTOMY  2007   fibroid, heavy periods  . BREAST BIOPSY Right 10/2018   x2  . CESAREAN SECTION      Social History:  reports that she has never smoked. She has never used smokeless tobacco. She reports that she does not drink alcohol or use drugs.  Allergies: No Known Allergies  Family History:  Family History  Problem Relation Age of Onset  . Multiple myeloma Mother   . Hypertension Father   . Stroke Father   . Heart attack Father   . Heart disease Father      Current Outpatient Medications:  .  albuterol (VENTOLIN HFA) 108 (90 Base) MCG/ACT inhaler, Inhale 2 puffs into the lungs every 6 (six) hours as needed for wheezing or shortness of breath., Disp: 18 g, Rfl: 6 .  amitriptyline (ELAVIL) 10 MG tablet, TAKE 1-2 TABLETS (10-20 MG TOTAL) BY MOUTH AT BEDTIME., Disp: 180 tablet, Rfl: 3 .   amphetamine-dextroamphetamine (ADDERALL) 10 MG tablet, Take 1 tablet (10 mg total) by mouth 2 (two) times daily with a meal., Disp: 60 tablet, Rfl: 0 .  ibuprofen (ADVIL) 800 MG tablet, Take 1 tablet (800 mg total) by mouth every 8 (eight) hours as needed for fever or headache., Disp: 90 tablet, Rfl: 2 .  lidocaine (LIDODERM) 5 %, Place 1 patch onto the skin daily. Remove & Discard patch within 12 hours or as directed by MD (Patient taking differently: Place 1 patch onto the skin daily as needed (pain). Remove & Discard patch within 12 hours or as directed by MD), Disp: 30 patch, Rfl: 4  Review of Systems:  Constitutional: Denies fever, chills, diaphoresis, appetite change and fatigue.  HEENT: Denies photophobia, eye pain, redness, hearing loss, ear pain, congestion, sore throat, rhinorrhea, sneezing, mouth sores, trouble swallowing, neck pain, neck stiffness and tinnitus.   Respiratory: Denies SOB, DOE, cough, chest tightness,  and wheezing.   Cardiovascular: Denies chest pain, palpitations and leg swelling.  Gastrointestinal: Denies nausea, vomiting, abdominal pain, diarrhea, constipation, blood in stool and abdominal distention.  Genitourinary: Denies dysuria, urgency, frequency, hematuria, flank pain and difficulty urinating.  Endocrine: Denies: hot or cold intolerance, sweats, changes in hair or nails, polyuria, polydipsia. Musculoskeletal: Denies myalgias, back pain, joint swelling, arthralgias and gait problem.  Skin: Denies pallor, rash and wound.  Neurological: Denies dizziness, seizures, syncope, weakness, light-headedness, numbness and  headaches.  Hematological: Denies adenopathy. Easy bruising, personal or family bleeding history  Psychiatric/Behavioral: Denies suicidal ideation, mood changes, confusion, nervousness, sleep disturbance and agitation    Physical Exam: Vitals:   04/19/20 0919  BP: 110/80  Pulse: 72  Temp: 97.9 F (36.6 C)  TempSrc: Temporal  SpO2: 99%  Weight:  204 lb 4.8 oz (92.7 kg)  Height: 5' 3"  (1.6 m)    Body mass index is 36.19 kg/m.   Constitutional: NAD, calm, comfortable Eyes: PERRL, lids and conjunctivae normal ENMT: Mucous membranes are moist.  Tympanic membrane is pearly white, no erythema or bulging. Neck: normal, supple, no masses, no thyromegaly Respiratory: clear to auscultation bilaterally, no wheezing, no crackles. Normal respiratory effort. No accessory muscle use.  Cardiovascular: Regular rate and rhythm, no murmurs / rubs / gallops. No extremity edema.  Abdomen: no tenderness, no masses palpated. No hepatosplenomegaly. Bowel sounds positive.  Musculoskeletal: no clubbing / cyanosis. No joint deformity upper and lower extremities. Good ROM, no contractures. Normal muscle tone.  Skin: no rashes, lesions, ulcers. No induration Neurologic: CN 2-12 grossly intact. Sensation intact, DTR normal. Strength 5/5 in all 4.  Psychiatric: Normal judgment and insight. Alert and oriented x 3. Normal mood.    Impression and Plan:  Encounter for preventive health examination  -Advised routine eye and dental care. -Screening labs today. -Healthy lifestyle discussed in detail. -She will receive a Tdap booster today, she is not interested in receiving Covid vaccines despite counseling. -Commence routine colon cancer screening age 54. -She had a negative mammogram in February of this year.  Obesity (BMI 35.0-39.9 without comorbidity) -Discussed healthy lifestyle, including increased physical activity and better food choices to promote weight loss. -She will come back in 3 months for continued weight management.  ADHD (attention deficit hyperactivity disorder), inattentive type -I do not prescribe her medications.    Patient Instructions  -Nice seeing you today!!  -Lab work today; will notify you once results are available.  -Tetanus booster today.  -See you back in 3 months for weight management. See below for healthy eating  ideas.   Mediterranean Diet A Mediterranean diet refers to food and lifestyle choices that are based on the traditions of countries located on the [employer]. This way of eating has been shown to help prevent certain conditions and improve outcomes for people who have chronic diseases, like kidney disease and heart disease. What are tips for following this plan? Lifestyle  Cook and eat meals together with your family, when possible.  Drink enough fluid to keep your urine clear or pale yellow.  Be physically active every day. This includes: ? Aerobic exercise like running or swimming. ? Leisure activities like gardening, walking, or housework.  Get 7-8 hours of sleep each night.  If recommended by your health care provider, drink red wine in moderation. This means 1 glass a day for nonpregnant women and 2 glasses a day for men. A glass of wine equals 5 oz (150 mL). Reading food labels   Check the serving size of packaged foods. For foods such as rice and pasta, the serving size refers to the amount of cooked product, not dry.  Check the total fat in packaged foods. Avoid foods that have saturated fat or trans fats.  Check the ingredients list for added sugars, such as corn syrup. Shopping  At the grocery store, buy most of your food from the areas near the walls of the store. This includes: ? Fresh fruits and vegetables (produce). ?  Grains, beans, nuts, and seeds. Some of these may be available in unpackaged forms or large amounts (in bulk). ? Fresh seafood. ? Poultry and eggs. ? Low-fat dairy products.  Buy whole ingredients instead of prepackaged foods.  Buy fresh fruits and vegetables in-season from local farmers markets.  Buy frozen fruits and vegetables in resealable bags.  If you do not have access to quality fresh seafood, buy precooked frozen shrimp or canned fish, such as tuna, salmon, or sardines.  Buy small amounts of raw or cooked vegetables, salads, or  olives from the deli or salad bar at your store.  Stock your pantry so you always have certain foods on hand, such as olive oil, canned tuna, canned tomatoes, rice, pasta, and beans. Cooking  Cook foods with extra-virgin olive oil instead of using butter or other vegetable oils.  Have meat as a side dish, and have vegetables or grains as your main dish. This means having meat in small portions or adding small amounts of meat to foods like pasta or stew.  Use beans or vegetables instead of meat in common dishes like chili or lasagna.  Experiment with different cooking methods. Try roasting or broiling vegetables instead of steaming or sauteing them.  Add frozen vegetables to soups, stews, pasta, or rice.  Add nuts or seeds for added healthy fat at each meal. You can add these to yogurt, salads, or vegetable dishes.  Marinate fish or vegetables using olive oil, lemon juice, garlic, and fresh herbs. Meal planning   Plan to eat 1 vegetarian meal one day each week. Try to work up to 2 vegetarian meals, if possible.  Eat seafood 2 or more times a week.  Have healthy snacks readily available, such as: ? Vegetable sticks with hummus. ? [place] yogurt. ? Fruit and nut trail mix.  Eat balanced meals throughout the week. This includes: ? Fruit: 2-3 servings a day ? Vegetables: 4-5 servings a day ? Low-fat dairy: 2 servings a day ? Fish, poultry, or lean meat: 1 serving a day ? Beans and legumes: 2 or more servings a week ? Nuts and seeds: 1-2 servings a day ? Whole grains: 6-8 servings a day ? Extra-virgin olive oil: 3-4 servings a day  Limit red meat and sweets to only a few servings a month What are my food choices?  Mediterranean diet ? Recommended  Grains: Whole-grain pasta. Brown rice. Bulgar wheat. Polenta. Couscous. Whole-wheat bread. [place].  Vegetables: Artichokes. Beets. Broccoli. Cabbage. Carrots. Eggplant. Green beans. Chard. Kale. Spinach. Onions. Leeks.  Peas. Squash. Tomatoes. Peppers. Radishes.  Fruits: Apples. Apricots. Avocado. Berries. Bananas. Cherries. Dates. Figs. Grapes. Lemons. Melon. Oranges. Peaches. Plums. Pomegranate.  Meats and other protein foods: Beans. Almonds. Sunflower seeds. Pine nuts. Peanuts. [place]. Salmon. Scallops. Shrimp. [place]. Tilapia. Clams. Oysters. Eggs.  Dairy: Low-fat milk. Cheese. Greek yogurt.  Beverages: Water. Red wine. Herbal tea.  Fats and oils: Extra virgin olive oil. Avocado oil. Grape seed oil.  Sweets and desserts: [place] yogurt with honey. Baked apples. Poached pears. Trail mix.  Seasoning and other foods: Basil. Cilantro. Coriander. Cumin. Mint. Parsley. Sage. Rosemary. Tarragon. Garlic. Oregano. Thyme. Pepper. Balsalmic vinegar. Tahini. Hummus. Tomato sauce. Olives. Mushrooms. ? Limit these  Grains: Prepackaged pasta or rice dishes. Prepackaged cereal with added sugar.  Vegetables: Deep fried potatoes (french fries).  Fruits: Fruit canned in syrup.  Meats and other protein foods: Beef. Pork. Lamb. Poultry with skin. Hot dogs. Berniece Salines.  Dairy: Ice cream. Sour cream. Whole milk.  Beverages:  Juice. Sugar-sweetened soft drinks. Beer. Liquor and spirits.  Fats and oils: Butter. Canola oil. Vegetable oil. Beef fat (tallow). Lard.  Sweets and desserts: Cookies. Cakes. Pies. Candy.  Seasoning and other foods: Mayonnaise. Premade sauces and marinades. The items listed may not be a complete list. Talk with your dietitian about what dietary choices are right for you. Summary  The Mediterranean diet includes both food and lifestyle choices.  Eat a variety of fresh fruits and vegetables, beans, nuts, seeds, and whole grains.  Limit the amount of red meat and sweets that you eat.  Talk with your health care provider about whether it is safe for you to drink red wine in moderation. This means 1 glass a day for nonpregnant women and 2 glasses a day for men. A glass of wine equals 5 oz (150 mL). This  information is not intended to replace advice given to you by your health care provider. Make sure you discuss any questions you have with your health care provider. Document Revised: 07/16/2016 Document Reviewed: 07/09/2016 Elsevier Patient Education  2020 [place], MD [facility]

## 2020-04-20 LAB — TSH: TSH: 1.65 u[IU]/mL (ref 0.450–4.500)

## 2020-04-20 LAB — CBC WITH DIFFERENTIAL/PLATELET
Basophils Absolute: 0 10*3/uL (ref 0.0–0.2)
Basos: 1 %
EOS (ABSOLUTE): 0.1 10*3/uL (ref 0.0–0.4)
Eos: 2 %
Hematocrit: 36 % (ref 34.0–46.6)
Hemoglobin: 12 g/dL (ref 11.1–15.9)
Immature Grans (Abs): 0 10*3/uL (ref 0.0–0.1)
Immature Granulocytes: 0 %
Lymphocytes Absolute: 1.3 10*3/uL (ref 0.7–3.1)
Lymphs: 36 %
MCH: 28.3 pg (ref 26.6–33.0)
MCHC: 33.3 g/dL (ref 31.5–35.7)
MCV: 85 fL (ref 79–97)
Monocytes Absolute: 0.4 10*3/uL (ref 0.1–0.9)
Monocytes: 12 %
Neutrophils Absolute: 1.8 10*3/uL (ref 1.4–7.0)
Neutrophils: 49 %
Platelets: 183 10*3/uL (ref 150–450)
RBC: 4.24 x10E6/uL (ref 3.77–5.28)
RDW: 12.6 % (ref 11.7–15.4)
WBC: 3.5 10*3/uL (ref 3.4–10.8)

## 2020-04-20 LAB — COMPREHENSIVE METABOLIC PANEL
ALT: 9 IU/L (ref 0–32)
AST: 11 IU/L (ref 0–40)
Albumin/Globulin Ratio: 1.3 (ref 1.2–2.2)
Albumin: 3.9 g/dL (ref 3.8–4.8)
Alkaline Phosphatase: 57 IU/L (ref 48–121)
BUN/Creatinine Ratio: 11 (ref 9–23)
BUN: 8 mg/dL (ref 6–24)
Bilirubin Total: 0.5 mg/dL (ref 0.0–1.2)
CO2: 23 mmol/L (ref 20–29)
Calcium: 9.1 mg/dL (ref 8.7–10.2)
Chloride: 106 mmol/L (ref 96–106)
Creatinine, Ser: 0.72 mg/dL (ref 0.57–1.00)
GFR calc Af Amer: 119 mL/min/{1.73_m2} (ref >59)
GFR calc non Af Amer: 103 mL/min/{1.73_m2} (ref >59)
Globulin, Total: 3 g/dL (ref 1.5–4.5)
Glucose: 86 mg/dL (ref 65–99)
Potassium: 4.4 mmol/L (ref 3.5–5.2)
Sodium: 139 mmol/L (ref 134–144)
Total Protein: 6.9 g/dL (ref 6.0–8.5)

## 2020-04-20 LAB — VITAMIN D 25 HYDROXY (VIT D DEFICIENCY, FRACTURES): Vit D, 25-Hydroxy: 35.3 ng/mL (ref 30.0–100.0)

## 2020-04-20 LAB — VITAMIN B12: Vitamin B-12: 252 pg/mL (ref 232–1245)

## 2020-04-20 LAB — HEMOGLOBIN A1C
Est. average glucose Bld gHb Est-mCnc: 105 mg/dL
Hgb A1c MFr Bld: 5.3 % (ref 4.8–5.6)

## 2020-04-20 LAB — LIPID PANEL
Chol/HDL Ratio: 3 ratio (ref 0.0–4.4)
Cholesterol, Total: 155 mg/dL (ref 100–199)
HDL: 51 mg/dL (ref >39)
LDL Chol Calc (NIH): 96 mg/dL (ref 0–99)
Triglycerides: 34 mg/dL (ref 0–149)
VLDL Cholesterol Cal: 8 mg/dL (ref 5–40)

## 2020-06-14 ENCOUNTER — Ambulatory Visit: Payer: BC Managed Care – PPO | Admitting: Certified Nurse Midwife

## 2020-07-18 ENCOUNTER — Other Ambulatory Visit: Payer: Self-pay | Admitting: Neurology

## 2020-07-18 DIAGNOSIS — Z1152 Encounter for screening for COVID-19: Secondary | ICD-10-CM

## 2020-07-18 NOTE — Addendum Note (Signed)
Addended by: Inis Sizer D on: 07/18/2020 04:36 PM   Modules accepted: Orders

## 2020-07-19 ENCOUNTER — Encounter: Payer: Self-pay | Admitting: Internal Medicine

## 2020-07-19 ENCOUNTER — Other Ambulatory Visit: Payer: Self-pay

## 2020-07-19 ENCOUNTER — Ambulatory Visit: Payer: BC Managed Care – PPO | Admitting: Internal Medicine

## 2020-07-19 VITALS — BP 110/80 | HR 59 | Temp 98.5°F | Wt 207.2 lb

## 2020-07-19 DIAGNOSIS — Z23 Encounter for immunization: Secondary | ICD-10-CM

## 2020-07-19 DIAGNOSIS — E669 Obesity, unspecified: Secondary | ICD-10-CM

## 2020-07-19 LAB — SAR COV2 SEROLOGY (COVID19)AB(IGG),IA: DiaSorin SARS-CoV-2 Ab, IgG: NEGATIVE

## 2020-07-19 NOTE — Progress Notes (Signed)
Established Patient Office Visit     This visit occurred during the SARS-CoV-2 public health emergency.  Safety protocols were in place, including screening questions prior to the visit, additional usage of staff PPE, and extensive cleaning of exam room while observing appropriate contact time as indicated for disinfecting solutions.    CC/Reason for Visit: Follow-up with weight management  HPI: Norma Soto is a 44 y.o. female who is coming in today for the above mentioned reasons. Past Medical History is significant for: ADHD prescribed 10 mg of Adderall not through this office, she also has obesity.  I am seeing her today mainly for weight management.  When I first saw her 3 months ago we discussed healthy lifestyle, meal planning, incorporating physical activity.  Unfortunately these lifestyle modifications did not happen.  She has gained 3 pounds since her last visit.  She is interested in receiving her flu vaccine today, she declines Covid vaccination.   Past Medical/Surgical History: Past Medical History:  Diagnosis Date  . Abnormal Pap smear of cervix    many yrs ago, just repeat done  . ADHD (attention deficit hyperactivity disorder), inattentive type   . Dysmenorrhea   . Fibroid   . Headache   . Menorrhagia     Past Surgical History:  Procedure Laterality Date  . ABDOMINAL HYSTERECTOMY  2007   fibroid, heavy periods  . BREAST BIOPSY Right 10/2018   x2  . CESAREAN SECTION      Social History:  reports that she has never smoked. She has never used smokeless tobacco. She reports that she does not drink alcohol and does not use drugs.  Allergies: No Known Allergies  Family History:  Family History  Problem Relation Age of Onset  . Multiple myeloma Mother   . Hypertension Father   . Stroke Father   . Heart attack Father   . Heart disease Father      Current Outpatient Medications:  .  albuterol (VENTOLIN HFA) 108 (90 Base) MCG/ACT inhaler,  Inhale 2 puffs into the lungs every 6 (six) hours as needed for wheezing or shortness of breath., Disp: 18 g, Rfl: 6 .  amitriptyline (ELAVIL) 10 MG tablet, TAKE 1-2 TABLETS (10-20 MG TOTAL) BY MOUTH AT BEDTIME., Disp: 180 tablet, Rfl: 3 .  amphetamine-dextroamphetamine (ADDERALL) 10 MG tablet, Take 1 tablet (10 mg total) by mouth 2 (two) times daily with a meal., Disp: 60 tablet, Rfl: 0 .  ibuprofen (ADVIL) 800 MG tablet, Take 1 tablet (800 mg total) by mouth every 8 (eight) hours as needed for fever or headache., Disp: 90 tablet, Rfl: 2 .  lidocaine (LIDODERM) 5 %, Place 1 patch onto the skin daily. Remove & Discard patch within 12 hours or as directed by MD (Patient taking differently: Place 1 patch onto the skin daily as needed (pain). Remove & Discard patch within 12 hours or as directed by MD), Disp: 30 patch, Rfl: 4  Review of Systems:  Constitutional: Denies fever, chills, diaphoresis, appetite change and fatigue.  HEENT: Denies photophobia, eye pain, redness, hearing loss, ear pain, congestion, sore throat, rhinorrhea, sneezing, mouth sores, trouble swallowing, neck pain, neck stiffness and tinnitus.   Respiratory: Denies SOB, DOE, cough, chest tightness,  and wheezing.   Cardiovascular: Denies chest pain, palpitations and leg swelling.  Gastrointestinal: Denies nausea, vomiting, abdominal pain, diarrhea, constipation, blood in stool and abdominal distention.  Genitourinary: Denies dysuria, urgency, frequency, hematuria, flank pain and difficulty urinating.  Endocrine: Denies: hot or cold  intolerance, sweats, changes in hair or nails, polyuria, polydipsia. Musculoskeletal: Denies myalgias, back pain, joint swelling, arthralgias and gait problem.  Skin: Denies pallor, rash and wound.  Neurological: Denies dizziness, seizures, syncope, weakness, light-headedness, numbness and headaches.  Hematological: Denies adenopathy. Easy bruising, personal or family bleeding history   Psychiatric/Behavioral: Denies suicidal ideation, mood changes, confusion, nervousness, sleep disturbance and agitation    Physical Exam: Vitals:   07/19/20 1309  BP: 110/80  Pulse: (!) 59  Temp: 98.5 F (36.9 C)  TempSrc: Oral  SpO2: 96%  Weight: 207 lb 3.2 oz (94 kg)    Body mass index is 36.7 kg/m.   Constitutional: NAD, calm, comfortable Eyes: PERRL, lids and conjunctivae normal ENMT: Mucous membranes are moist.  Respiratory: clear to auscultation bilaterally, no wheezing, no crackles. Normal respiratory effort. No accessory muscle use.  Cardiovascular: Regular rate and rhythm, no murmurs / rubs / gallops. No extremity edema.  Neurologic: Grossly intact and nonfocal Psychiatric: Normal judgment and insight. Alert and oriented x 3. Normal mood.    Impression and Plan:  Obesity (BMI 35.0-39.9 without comorbidity) -Discussed healthy lifestyle, including increased physical activity and better food choices to promote weight loss. -We have again discussed meal planning, premade meal services, incorporating 30 minutes of exercise on a daily basis. -She will return again in 3 months for follow-up.   Patient Instructions  -Nice seeing you today!!  -keep working on healthier habits.  -Schedule follow up in 3 months.  -Flu vaccine today.     Lelon Frohlich, MD [facility]

## 2020-07-19 NOTE — Patient Instructions (Signed)
-  Nice seeing you today!!  -keep working on healthier habits.  -Schedule follow up in 3 months.  -Flu vaccine today.

## 2020-08-16 DIAGNOSIS — Z20828 Contact with and (suspected) exposure to other viral communicable diseases: Secondary | ICD-10-CM | POA: Diagnosis not present

## 2020-10-18 ENCOUNTER — Ambulatory Visit: Payer: BC Managed Care – PPO | Admitting: Internal Medicine

## 2020-10-18 ENCOUNTER — Other Ambulatory Visit: Payer: Self-pay

## 2020-10-18 ENCOUNTER — Encounter: Payer: Self-pay | Admitting: Internal Medicine

## 2020-10-18 VITALS — BP 110/80 | HR 59 | Temp 98.2°F | Wt 203.3 lb

## 2020-10-18 DIAGNOSIS — E669 Obesity, unspecified: Secondary | ICD-10-CM

## 2020-10-18 MED ORDER — OZEMPIC (0.25 OR 0.5 MG/DOSE) 2 MG/1.5ML ~~LOC~~ SOPN: 0.2500 mg | PEN_INJECTOR | SUBCUTANEOUS | 2 refills | Status: DC

## 2020-10-18 NOTE — Patient Instructions (Signed)
-  Nice seeing you today!!  -Start Ozempic 0.25 mg injected weekly until you see me next.  -Schedule follow up in 3 months.

## 2020-10-18 NOTE — Progress Notes (Signed)
Established Patient Office Visit     This visit occurred during the SARS-CoV-2 public health emergency.  Safety protocols were in place, including screening questions prior to the visit, additional usage of staff PPE, and extensive cleaning of exam room while observing appropriate contact time as indicated for disinfecting solutions.    CC/Reason for Visit: 66-monthfollow-up weight management  HPI: Norma ESHis a 44y.o. female who is coming in today for the above mentioned reasons. Past Medical History is significant for: Obesity with the most recent BMI of 36.  She is here today for weight management.  She has been working really hard on lifestyle modifications.  She is eating low-carb, she is having frequent smaller meals.  She has been able to lose 4 pounds since her last visit.  She is now interested in discussing weight management medication.   Past Medical/Surgical History: Past Medical History:  Diagnosis Date  . Abnormal Pap smear of cervix    many yrs ago, just repeat done  . ADHD (attention deficit hyperactivity disorder), inattentive type   . Dysmenorrhea   . Fibroid   . Headache   . Menorrhagia     Past Surgical History:  Procedure Laterality Date  . ABDOMINAL HYSTERECTOMY  2007   fibroid, heavy periods  . BREAST BIOPSY Right 10/2018   x2  . CESAREAN SECTION      Social History:  reports that she has never smoked. She has never used smokeless tobacco. She reports that she does not drink alcohol and does not use drugs.  Allergies: No Known Allergies  Family History:  Family History  Problem Relation Age of Onset  . Multiple myeloma Mother   . Hypertension Father   . Stroke Father   . Heart attack Father   . Heart disease Father      Current Outpatient Medications:  .  albuterol (VENTOLIN HFA) 108 (90 Base) MCG/ACT inhaler, Inhale 2 puffs into the lungs every 6 (six) hours as needed for wheezing or shortness of breath., Disp: 18 g,  Rfl: 6 .  amitriptyline (ELAVIL) 10 MG tablet, TAKE 1-2 TABLETS (10-20 MG TOTAL) BY MOUTH AT BEDTIME., Disp: 180 tablet, Rfl: 3 .  amphetamine-dextroamphetamine (ADDERALL) 10 MG tablet, Take 1 tablet (10 mg total) by mouth 2 (two) times daily with a meal., Disp: 60 tablet, Rfl: 0 .  ibuprofen (ADVIL) 800 MG tablet, Take 1 tablet (800 mg total) by mouth every 8 (eight) hours as needed for fever or headache., Disp: 90 tablet, Rfl: 2 .  lidocaine (LIDODERM) 5 %, Place 1 patch onto the skin daily. Remove & Discard patch within 12 hours or as directed by MD (Patient taking differently: Place 1 patch onto the skin daily as needed (pain). Remove & Discard patch within 12 hours or as directed by MD), Disp: 30 patch, Rfl: 4 .  Semaglutide,0.25 or 0.5MG/DOS, (OZEMPIC, 0.25 OR 0.5 MG/DOSE,) 2 MG/1.5ML SOPN, Inject 0.25 mg into the skin once a week., Disp: 1.5 mL, Rfl: 2  Review of Systems:  Constitutional: Denies fever, chills, diaphoresis, appetite change and fatigue.  HEENT: Denies photophobia, eye pain, redness, hearing loss, ear pain, congestion, sore throat, rhinorrhea, sneezing, mouth sores, trouble swallowing, neck pain, neck stiffness and tinnitus.   Respiratory: Denies SOB, DOE, cough, chest tightness,  and wheezing.   Cardiovascular: Denies chest pain, palpitations and leg swelling.  Gastrointestinal: Denies nausea, vomiting, abdominal pain, diarrhea, constipation, blood in stool and abdominal distention.  Genitourinary: Denies dysuria, urgency,  frequency, hematuria, flank pain and difficulty urinating.  Endocrine: Denies: hot or cold intolerance, sweats, changes in hair or nails, polyuria, polydipsia. Musculoskeletal: Denies myalgias, back pain, joint swelling, arthralgias and gait problem.  Skin: Denies pallor, rash and wound.  Neurological: Denies dizziness, seizures, syncope, weakness, light-headedness, numbness and headaches.  Hematological: Denies adenopathy. Easy bruising, personal or family  bleeding history  Psychiatric/Behavioral: Denies suicidal ideation, mood changes, confusion, nervousness, sleep disturbance and agitation    Physical Exam: Vitals:   10/18/20 1310  BP: 110/80  Pulse: (!) 59  Temp: 98.2 F (36.8 C)  TempSrc: Oral  SpO2: 97%  Weight: 203 lb 4.8 oz (92.2 kg)    Body mass index is 36.01 kg/m.   Constitutional: NAD, calm, comfortable Eyes: PERRL, lids and conjunctivae normal ENMT: Mucous membranes are moist.  Respiratory: clear to auscultation bilaterally, no wheezing, no crackles. Normal respiratory effort. No accessory muscle use.  Cardiovascular: Regular rate and rhythm, no murmurs / rubs / gallops. No extremity edema.  Neurologic: Grossly intact and nonfocal Psychiatric: Normal judgment and insight. Alert and oriented x 3. Normal mood.    Impression and Plan:  Obesity (BMI 35.0-39.9 without comorbidity)  -Discussed healthy lifestyle, including increased physical activity and better food choices to promote weight loss. -She has been congratulated on her weight loss thus far, instructed to continue lifestyle modifications. -She has tried phentermine in the past and cannot tolerate it due to palpitations. -After discussion she has elected to try a GLP-1 for weight loss.  She understands that co-pay might be substantial but she will look into it.  She has been prescribed Ozempic 0.25 mg injected weekly until I see her next.   Patient Instructions  -Nice seeing you today!!  -Start Ozempic 0.25 mg injected weekly until you see me next.  -Schedule follow up in 3 months.     Lelon Frohlich, MD [facility]

## 2020-10-31 ENCOUNTER — Telehealth: Payer: Self-pay | Admitting: Internal Medicine

## 2020-10-31 DIAGNOSIS — E669 Obesity, unspecified: Secondary | ICD-10-CM

## 2020-10-31 MED ORDER — OZEMPIC (0.25 OR 0.5 MG/DOSE) 2 MG/1.5ML ~~LOC~~ SOPN: 0.2500 mg | PEN_INJECTOR | SUBCUTANEOUS | 2 refills | Status: DC

## 2020-10-31 NOTE — Telephone Encounter (Signed)
Rx sent 

## 2020-10-31 NOTE — Telephone Encounter (Signed)
Pt is calling in stating that due to her insurance she needs to use [facility] or a Mail order.  Pt has a current Rx Semaglutide [facility])  [facility] and [facility] to the [facility] [address] in [place].  [facility] is requiring a #90 quaninty to be added so that they can fill it.

## 2020-11-26 ENCOUNTER — Encounter (HOSPITAL_COMMUNITY): Payer: Self-pay

## 2020-11-26 ENCOUNTER — Emergency Department (HOSPITAL_COMMUNITY)
Admission: EM | Admit: 2020-11-26 | Discharge: 2020-11-26 | Disposition: A | Discharge status: Home / Self Care | Payer: BC Managed Care – PPO | Attending: Emergency Medicine | Admitting: Emergency Medicine

## 2020-11-26 ENCOUNTER — Other Ambulatory Visit: Payer: Self-pay

## 2020-11-26 DIAGNOSIS — U071 COVID-19: Secondary | ICD-10-CM

## 2020-11-26 DIAGNOSIS — R509 Fever, unspecified: Secondary | ICD-10-CM | POA: Diagnosis not present

## 2020-11-26 DIAGNOSIS — Z79899 Other long term (current) drug therapy: Secondary | ICD-10-CM | POA: Diagnosis not present

## 2020-11-26 DIAGNOSIS — R519 Headache, unspecified: Secondary | ICD-10-CM | POA: Diagnosis not present

## 2020-11-26 LAB — POC SARS CORONAVIRUS 2 AG -  ED: SARS Coronavirus 2 Ag: POSITIVE — AB

## 2020-11-26 NOTE — ED Triage Notes (Addendum)
patient c/o chills, fever, headache x 2 days.  Patient also c/o right lower back pain that radiates into the right leg x 2 days.

## 2020-11-26 NOTE — Discharge Instructions (Addendum)
Take Tylenol or Motrin for fevers or body aches.  Return to ER if you have any difficulty in breathing, vomiting, chest pain or other new concerning symptom.  Follow-up with your primary doctor.  Notify your close contacts of your positive test.  Please follow isolation precautions as discussed.

## 2020-11-27 ENCOUNTER — Telehealth: Payer: Self-pay | Admitting: Internal Medicine

## 2020-11-27 ENCOUNTER — Other Ambulatory Visit (HOSPITAL_COMMUNITY): Payer: Self-pay | Admitting: Oncology

## 2020-11-27 ENCOUNTER — Ambulatory Visit (HOSPITAL_COMMUNITY)
Admission: RE | Admit: 2020-11-27 | Discharge: 2020-11-27 | Disposition: A | Discharge status: Home / Self Care | Payer: BC Managed Care – PPO | Source: Ambulatory Visit | Attending: Pulmonary Disease | Admitting: Pulmonary Disease

## 2020-11-27 ENCOUNTER — Encounter (HOSPITAL_COMMUNITY): Payer: Self-pay | Admitting: Oncology

## 2020-11-27 ENCOUNTER — Telehealth (HOSPITAL_COMMUNITY): Payer: Self-pay | Admitting: Oncology

## 2020-11-27 DIAGNOSIS — Z283 Underimmunization status: Secondary | ICD-10-CM | POA: Insufficient documentation

## 2020-11-27 DIAGNOSIS — U071 COVID-19: Secondary | ICD-10-CM | POA: Diagnosis not present

## 2020-11-27 DIAGNOSIS — Z6825 Body mass index (BMI) 25.0-25.9, adult: Secondary | ICD-10-CM | POA: Diagnosis not present

## 2020-11-27 MED ORDER — FAMOTIDINE IN NACL 20-0.9 MG/50ML-% IV SOLN: 20.0000 mg | Freq: Once | INTRAVENOUS | Status: DC | PRN

## 2020-11-27 MED ORDER — METHYLPREDNISOLONE SODIUM SUCC 125 MG IJ SOLR: 125.0000 mg | Freq: Once | INTRAMUSCULAR | Status: DC | PRN

## 2020-11-27 MED ORDER — EPINEPHRINE 0.3 MG/0.3ML IJ SOAJ: 0.3000 mg | Freq: Once | INTRAMUSCULAR | Status: DC | PRN

## 2020-11-27 MED ORDER — SODIUM CHLORIDE 0.9 % IV SOLN: Freq: Once | INTRAVENOUS | Status: AC

## 2020-11-27 MED ORDER — DIPHENHYDRAMINE HCL 50 MG/ML IJ SOLN: 50.0000 mg | Freq: Once | INTRAMUSCULAR | Status: DC | PRN

## 2020-11-27 MED ORDER — SODIUM CHLORIDE 0.9 % IV SOLN: INTRAVENOUS | Status: DC | PRN

## 2020-11-27 MED ORDER — ALBUTEROL SULFATE HFA 108 (90 BASE) MCG/ACT IN AERS: 2.0000 | INHALATION_SPRAY | Freq: Once | RESPIRATORY_TRACT | Status: DC | PRN

## 2020-11-27 NOTE — Telephone Encounter (Signed)
The patient tested positive for COVID yesterday. She said that they told her that she may be able to have the infusion done.  The ED didn't give her much information on what she needs to do and when she needs to follow up with her provider.  Please advise

## 2020-11-27 NOTE — Progress Notes (Signed)
Patient reviewed Fact Sheet for Patients, Parents, and Caregivers for Emergency Use Authorization (EUA) of Casirivimab and Imdevimab for the Treatment of Coronavirus. Patient also reviewed and is agreeable to the estimated cost of treatment. Patient is agreeable to proceed.   

## 2020-11-27 NOTE — Telephone Encounter (Signed)
Patient is aware 

## 2020-11-27 NOTE — Progress Notes (Signed)
  Diagnosis: COVID-19  Physician:Dr Wright   Procedure: [facility] Infusion Clinic Med: casirivimab\imdevimab infusion - Provided patient with casirivimab\imdevimab fact sheet for patients, parents and caregivers prior to infusion.  Complications: No immediate complications noted.  Discharge: Discharged home   Janaisa Birkland W 11/27/2020  

## 2020-11-27 NOTE — Telephone Encounter (Signed)
I connected by phone with Norma Soto to discuss the potential use of an new treatment for mild to moderate COVID-19 viral infection in non-hospitalized patients.   This patient is a age/sex that meets the FDA criteria for Emergency Use Authorization of casirivimab\imdevimab.  Has a (+) direct SARS-CoV-2 viral test result 1. Has mild or moderate COVID-19  2. Is ? 44 years of age and weighs ? 40 kg 3. Is NOT hospitalized due to COVID-19 4. Is NOT requiring oxygen therapy or requiring an increase in baseline oxygen flow rate due to COVID-19 5. Is within 10 days of symptom onset 6. Has at least one of the high risk factor(s) for progression to severe COVID-19 and/or hospitalization as defined in EUA. ? Specific high risk criteria : Past Medical History:  Diagnosis Date  . Abnormal Pap smear of cervix    many yrs ago, just repeat done  . ADHD (attention deficit hyperactivity disorder), inattentive type   . Dysmenorrhea   . Fibroid   . Headache   . Menorrhagia   ?  ? Unvaccinated ? Obese- BMI 36.14   Symptom onset  11/24/20   I have spoken and communicated the following to the patient or parent/caregiver:   1. FDA has authorized the emergency use of casirivimab\imdevimab for the treatment of mild to moderate COVID-19 in adults and pediatric patients with positive results of direct SARS-CoV-2 viral testing who are 25 years of age and older weighing at least 40 kg, and who are at high risk for progressing to severe COVID-19 and/or hospitalization.   2. The significant known and potential risks and benefits of casirivimab\imdevimab, and the extent to which such potential risks and benefits are unknown.   3. Information on available alternative treatments and the risks and benefits of those alternatives, including clinical trials.   4. Patients treated with casirivimab\imdevimab should continue to self-isolate and use infection control measures (e.g., wear mask, isolate, social distance,  avoid sharing personal items, clean and disinfect "high touch" surfaces, and frequent handwashing) according to CDC guidelines.    5. The patient or parent/caregiver has the option to accept or refuse casirivimab\imdevimab .   After reviewing this information with the patient, The patient agreed to proceed with receiving casirivimab\imdevimab infusion and will be provided a copy of the Fact sheet prior to receiving the infusion.Mignon Pine, AGNP-C [phone] (Infusion Center Hotline)
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
## 2020-11-27 NOTE — Telephone Encounter (Signed)
Have sent a message to the monoclonal ab infusion clinic. If she qualifies, they will call her to schedule.

## 2020-11-27 NOTE — Discharge Instructions (Signed)
10 Things You Can Do to Manage Your COVID-19 Symptoms at Home If you have possible or confirmed COVID-19: 1. Stay home from work and school. And stay away from other public places. If you must go out, avoid using any kind of public transportation, ridesharing, or taxis. 2. Monitor your symptoms carefully. If your symptoms get worse, call your healthcare provider immediately. 3. Get rest and stay hydrated. 4. If you have a medical appointment, call the healthcare provider ahead of time and tell them that you have or may have COVID-19. 5. For medical emergencies, call 911 and notify the dispatch personnel that you have or may have COVID-19. 6. Cover your cough and sneezes with a tissue or use the inside of your elbow. 7. Wash your hands often with soap and water for at least 20 seconds or clean your hands with an alcohol-based hand sanitizer that contains at least 60% alcohol. 8. As much as possible, stay in a specific room and away from other people in your home. Also, you should use a separate bathroom, if available. If you need to be around other people in or outside of the home, wear a mask. 9. Avoid sharing personal items with other people in your household, like dishes, towels, and bedding. 10. Clean all surfaces that are touched often, like counters, tabletops, and doorknobs. Use household cleaning sprays or wipes according to the label instructions. cdc.gov/coronavirus 05/31/2019 This information is not intended to replace advice given to you by your health care provider. Make sure you discuss any questions you have with your health care provider. Document Revised: 11/02/2019 Document Reviewed: 11/02/2019 Elsevier Patient Education  2020 Elsevier Inc. What types of side effects do monoclonal antibody drugs cause?  Common side effects  In general, the more common side effects caused by monoclonal antibody drugs include: . Allergic reactions, such as hives or itching . Flu-like signs and  symptoms, including chills, fatigue, fever, and muscle aches and pains . Nausea, vomiting . Diarrhea . Skin rashes . Low blood pressure   The CDC is recommending patients who receive monoclonal antibody treatments wait at least 90 days before being vaccinated.  Currently, there are no data on the safety and efficacy of mRNA COVID-19 vaccines in persons who received monoclonal antibodies or convalescent plasma as part of COVID-19 treatment. Based on the estimated half-life of such therapies as well as evidence suggesting that reinfection is uncommon in the 90 days after initial infection, vaccination should be deferred for at least 90 days, as a precautionary measure until additional information becomes available, to avoid interference of the antibody treatment with vaccine-induced immune responses. If you have any questions or concerns after the infusion please call the Advanced Practice Provider on call at [phone]. This number is ONLY intended for your use regarding questions or concerns about the infusion post-treatment side-effects.  Please do not provide this number to others for use. For return to work notes please contact your primary care provider.   If someone you know is interested in receiving treatment please have them call the COVID hotline at [phone].   

## 2020-12-03 ENCOUNTER — Telehealth: Payer: Self-pay | Admitting: Internal Medicine

## 2020-12-03 NOTE — Telephone Encounter (Signed)
Virtual visit scheduled.  

## 2020-12-03 NOTE — Telephone Encounter (Signed)
Recommend OV, virtual

## 2020-12-03 NOTE — Telephone Encounter (Signed)
Pt call and want something call in for her cough.

## 2020-12-03 NOTE — Telephone Encounter (Signed)
[facility] sent to [facility] [address]

## 2020-12-05 ENCOUNTER — Telehealth: Payer: Self-pay | Admitting: Internal Medicine
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
## 2020-12-06 ENCOUNTER — Encounter: Payer: Self-pay | Admitting: Family Medicine

## 2020-12-06 ENCOUNTER — Telehealth (INDEPENDENT_AMBULATORY_CARE_PROVIDER_SITE_OTHER): Payer: BC Managed Care – PPO | Admitting: Family Medicine

## 2020-12-06 ENCOUNTER — Other Ambulatory Visit: Payer: Self-pay | Admitting: Family Medicine

## 2020-12-06 VITALS — Wt 199.0 lb

## 2020-12-06 DIAGNOSIS — R059 Cough, unspecified: Secondary | ICD-10-CM | POA: Diagnosis not present

## 2020-12-06 DIAGNOSIS — U071 COVID-19: Secondary | ICD-10-CM

## 2020-12-06 MED ORDER — PROMETHAZINE-DM 6.25-15 MG/5ML PO SYRP: 5.0000 mL | ORAL_SOLUTION | Freq: Four times a day (QID) | ORAL | 0 refills | Status: DC | PRN

## 2020-12-06 MED ORDER — SPACER/AERO-HOLDING CHAMBERS DEVI: 1.0000 [IU] | Freq: Every day | 0 refills | Status: DC | PRN

## 2020-12-06 MED ORDER — PROMETHAZINE-DM 6.25-15 MG/5ML PO SYRP
5.0000 mL | ORAL_SOLUTION | Freq: Four times a day (QID) | ORAL | 0 refills | Status: DC | PRN
Start: 2020-12-06 — End: 2021-01-24

## 2020-12-06 MED ORDER — GUAIFENESIN-CODEINE 100-10 MG/5ML PO SOLN: 5.0000 mL | Freq: Four times a day (QID) | ORAL | 0 refills | Status: DC | PRN

## 2020-12-12 ENCOUNTER — Telehealth: Payer: Self-pay | Admitting: Internal Medicine

## 2020-12-12 NOTE — Telephone Encounter (Signed)
Patient is calling and requesting a call back regarding paperwork and medication, please advise. CB is [phone]

## 2020-12-18 NOTE — Telephone Encounter (Signed)
Form completed and faxed.  Patient is aware.  Reviewed medication directions with patient.

## 2020-12-18 NOTE — Telephone Encounter (Signed)
Pt is calling in to check the status of her FMLA pw that was sent in and was to be sent back to them by 12/15/2020 and pt would also like to know what dosage she should be taking of Rx  Semaglutide, 0.25 or 0.5 MG (OZEMPIC).  Pt would like to have a call back today about the medication dosage.

## 2021-01-06 ENCOUNTER — Other Ambulatory Visit: Payer: Self-pay | Admitting: Internal Medicine

## 2021-01-06 DIAGNOSIS — Z1231 Encounter for screening mammogram for malignant neoplasm of breast: Secondary | ICD-10-CM

## 2021-01-17 ENCOUNTER — Telehealth: Payer: BC Managed Care – PPO | Admitting: Internal Medicine

## 2021-01-24 ENCOUNTER — Encounter: Payer: Self-pay | Admitting: Internal Medicine

## 2021-01-24 ENCOUNTER — Telehealth (INDEPENDENT_AMBULATORY_CARE_PROVIDER_SITE_OTHER): Payer: BC Managed Care – PPO | Admitting: Internal Medicine

## 2021-01-24 VITALS — Wt 197.0 lb

## 2021-01-24 DIAGNOSIS — R059 Cough, unspecified: Secondary | ICD-10-CM

## 2021-01-24 DIAGNOSIS — E669 Obesity, unspecified: Secondary | ICD-10-CM

## 2021-01-24 MED ORDER — OZEMPIC (0.25 OR 0.5 MG/DOSE) 2 MG/1.5ML ~~LOC~~ SOPN: 0.5000 mg | PEN_INJECTOR | SUBCUTANEOUS | 2 refills | Status: DC

## 2021-01-24 MED ORDER — OZEMPIC (0.25 OR 0.5 MG/DOSE) 2 MG/1.5ML ~~LOC~~ SOPN: 0.2500 mg | PEN_INJECTOR | SUBCUTANEOUS | 2 refills | Status: DC

## 2021-01-24 NOTE — Progress Notes (Signed)
Virtual Visit via Video Note  I connected with Norma Soto on 01/24/21 at  4:00 PM EST by a video enabled telemedicine application and verified that I am speaking with the correct person using two identifiers.  Location patient: home Location provider: work office Persons participating in the virtual visit: patient, provider  I discussed the limitations of evaluation and management by telemedicine and the availability of in person appointments. The patient expressed understanding and agreed to proceed.   HPI: She had Covid in January.  Has been dealing with continued cough and postnasal drip.  Other than that has recovered well.  She has been taking semaglutide 0.25 mg weekly for weight loss.  She has lost an additional 2 pounds since we last spoke.  She got off track with her healthy lifestyle during her Covid infection but is now back into meal planning and will try to incorporate some more exercise.   ROS: Constitutional: Denies fever, chills, diaphoresis, appetite change and fatigue.  HEENT: Denies photophobia, eye pain, redness, hearing loss, ear pain,  mouth sores, trouble swallowing, neck pain, neck stiffness and tinnitus.   Respiratory: Denies SOB, DOE,  chest tightness,  and wheezing.   Cardiovascular: Denies chest pain, palpitations and leg swelling.  Gastrointestinal: Denies nausea, vomiting, abdominal pain, diarrhea, constipation, blood in stool and abdominal distention.  Genitourinary: Denies dysuria, urgency, frequency, hematuria, flank pain and difficulty urinating.  Endocrine: Denies: hot or cold intolerance, sweats, changes in hair or nails, polyuria, polydipsia. Musculoskeletal: Denies myalgias, back pain, joint swelling, arthralgias and gait problem.  Skin: Denies pallor, rash and wound.  Neurological: Denies dizziness, seizures, syncope, weakness, light-headedness, numbness and headaches.  Hematological: Denies adenopathy. Easy bruising, personal or  family bleeding history  Psychiatric/Behavioral: Denies suicidal ideation, mood changes, confusion, nervousness, sleep disturbance and agitation   Past Medical History:  Diagnosis Date  . Abnormal Pap smear of cervix    many yrs ago, just repeat done  . ADHD (attention deficit hyperactivity disorder), inattentive type   . Dysmenorrhea   . Fibroid   . Headache   . Menorrhagia     Past Surgical History:  Procedure Laterality Date  . ABDOMINAL HYSTERECTOMY  2007   fibroid, heavy periods  . BREAST BIOPSY Right 10/2018   x2  . CESAREAN SECTION      Family History  Problem Relation Age of Onset  . Multiple myeloma Mother   . Hypertension Father   . Stroke Father   . Heart attack Father   . Heart disease Father     SOCIAL HX:   reports that she has never smoked. She has never used smokeless tobacco. She reports that she does not drink alcohol and does not use drugs.   Current Outpatient Medications:  .  albuterol (VENTOLIN HFA) 108 (90 Base) MCG/ACT inhaler, Inhale 2 puffs into the lungs every 6 (six) hours as needed for wheezing or shortness of breath., Disp: 18 g, Rfl: 6 .  amitriptyline (ELAVIL) 10 MG tablet, TAKE 1-2 TABLETS (10-20 MG TOTAL) BY MOUTH AT BEDTIME., Disp: 180 tablet, Rfl: 3 .  amphetamine-dextroamphetamine (ADDERALL) 10 MG tablet, Take 1 tablet (10 mg total) by mouth 2 (two) times daily with a meal., Disp: 60 tablet, Rfl: 0 .  ibuprofen (ADVIL) 800 MG tablet, Take 1 tablet (800 mg total) by mouth every 8 (eight) hours as needed for fever or headache., Disp: 90 tablet, Rfl: 2 .  lidocaine (LIDODERM) 5 %, Place 1 patch onto the skin daily. Remove &  Discard patch within 12 hours or as directed by MD (Patient taking differently: Place 1 patch onto the skin daily as needed (pain). Remove & Discard patch within 12 hours or as directed by MD), Disp: 30 patch, Rfl: 4 .  Semaglutide,0.25 or 0.5MG/DOS, (OZEMPIC, 0.25 OR 0.5 MG/DOSE,) 2 MG/1.5ML SOPN, Inject 0.25 mg into  the skin once a week., Disp: 1.5 mL, Rfl: 2  EXAM:   VITALS per patient if applicable: Weight of 197 pounds.  GENERAL: alert, oriented, appears well and in no acute distress  HEENT: atraumatic, conjunttiva clear, no obvious abnormalities on inspection of external nose and ears  NECK: normal movements of the head and neck  LUNGS: on inspection no signs of respiratory distress, breathing rate appears normal, no obvious gross increased work of breathing, gasping or wheezing  CV: no obvious cyanosis  MS: moves all visible extremities without noticeable abnormality  PSYCH/NEURO: pleasant and cooperative, no obvious depression or anxiety, speech and thought processing grossly intact  ASSESSMENT AND PLAN:   Obesity (BMI 35.0-39.9 without comorbidity) -Discussed healthy lifestyle, including increased physical activity and better food choices to promote weight loss. -Increase semaglutide from 0.25 to 0.5 mg weekly. -Follow-up in 8 to 12 weeks.  Cough -Have advised she use an antihistamine and guaifenesin OTC.     I discussed the assessment and treatment plan with the patient. The patient was provided an opportunity to ask questions and all were answered. The patient agreed with the plan and demonstrated an understanding of the instructions.   The patient was advised to call back or seek an in-person evaluation if the symptoms worsen or if the condition fails to improve as anticipated.    Estela Hernandez Acosta, MD  [facility]  

## 2021-02-21 ENCOUNTER — Ambulatory Visit: Payer: BC Managed Care – PPO

## 2021-02-27 ENCOUNTER — Other Ambulatory Visit: Payer: Self-pay

## 2021-02-28 ENCOUNTER — Ambulatory Visit: Payer: BC Managed Care – PPO | Admitting: Internal Medicine

## 2021-02-28 ENCOUNTER — Encounter: Payer: Self-pay | Admitting: Internal Medicine

## 2021-02-28 VITALS — BP 120/90 | HR 60 | Temp 98.2°F | Wt 199.0 lb

## 2021-02-28 DIAGNOSIS — E669 Obesity, unspecified: Secondary | ICD-10-CM | POA: Diagnosis not present

## 2021-02-28 DIAGNOSIS — G5603 Carpal tunnel syndrome, bilateral upper limbs: Secondary | ICD-10-CM

## 2021-02-28 NOTE — Patient Instructions (Signed)
-Nice seeing you today!!  -Wear wrist splints for at least 18 hours a day.  -Schedule follow up in 3 months.   Carpal Tunnel Syndrome  Carpal tunnel syndrome is a condition that causes pain, weakness, and numbness in your hand and arm. Numbness is when you cannot feel an area in your body. The carpal tunnel is a narrow area that is on the palm side of your wrist. Repeated wrist motion or certain diseases may cause swelling in the tunnel. This swelling can pinch the main nerve in the wrist. This nerve is called the median nerve. What are the causes? This condition may be caused by:  Moving your hand and wrist over and over again while doing a task.  Injury to the wrist.  Arthritis.  A sac of fluid (cyst) or abnormal growth (tumor) in the carpal tunnel.  Fluid buildup during pregnancy.  Use of tools that vibrate. Sometimes the cause is not known. What increases the risk? The following factors may make you more likely to have this condition:  Having a job that makes you do these things: ? Move your hand over and over again. ? Work with tools that vibrate, such as drills or sanders.  Being a woman.  Having diabetes, obesity, thyroid problems, or kidney failure. What are the signs or symptoms? Symptoms of this condition include:  A tingling feeling in your fingers.  Tingling or loss of feeling in your hand.  Pain in your entire arm. This pain may get worse when you bend your wrist and elbow for a long time.  Pain in your wrist that goes up your arm to your shoulder.  Pain that goes down into your palm or fingers.  Weakness in your hands. You may find it hard to grab and hold items. You may feel worse at night. How is this treated? This condition may be treated with:  Lifestyle changes. You will be asked to stop or change the activity that caused your problem.  Doing exercises and activities that make bones, muscles, and tendons stronger (physical  therapy).  Learning how to use your hand again (occupational therapy).  Medicines for pain and swelling. You may have injections in your wrist.  A wrist splint or brace.  Surgery. Follow these instructions at home: If you have a splint or brace:  Wear the splint or brace as told by your doctor. Take it off only as told by your doctor.  Loosen the splint if your fingers: ? Tingle. ? Become numb. ? Turn cold and blue.  Keep the splint or brace clean.  If the splint or brace is not waterproof: ? Do not let it get wet. ? Cover it with a watertight covering when you take a bath or a shower. Managing pain, stiffness, and swelling If told, put ice on the painful area:  If you have a removable splint or brace, remove it as told by your doctor.  Put ice in a plastic bag.  Place a towel between your skin and the bag.  Leave the ice on for 20 minutes, 2-3 times per day. Do not fall asleep with the cold pack on your skin.  Take off the ice if your skin turns bright red. This is very important. If you cannot feel pain, heat, or cold, you have a greater risk of damage to the area. Move your fingers often to reduce stiffness and swelling.   General instructions  Take over-the-counter and prescription medicines only as told by your  doctor.  Rest your wrist from any activity that may cause pain. If needed, talk with your boss at work about changes that can help your wrist heal.  Do exercises as told by your doctor, physical therapist, or occupational therapist.  Keep all follow-up visits. Contact a doctor if:  You have new symptoms.  Medicine does not help your pain.  Your symptoms get worse. Get help right away if:  You have very bad numbness or tingling in your wrist or hand. Summary  Carpal tunnel syndrome is a condition that causes pain in your hand and arm.  It is often caused by repeated wrist motions.  Lifestyle changes and medicines are used to treat this problem.  Surgery may help in very bad cases.  Follow your doctor's instructions about wearing a splint, resting your wrist, keeping follow-up visits, and calling for help. This information is not intended to replace advice given to you by your health care provider. Make sure you discuss any questions you have with your health care provider. Document Revised: 03/28/2020 Document Reviewed: 03/28/2020 Elsevier Patient Education  [place].

## 2021-02-28 NOTE — Progress Notes (Signed)
Established Patient Office Visit     This visit occurred during the SARS-CoV-2 public health emergency.  Safety protocols were in place, including screening questions prior to the visit, additional usage of staff PPE, and extensive cleaning of exam room while observing appropriate contact time as indicated for disinfecting solutions.    CC/Reason for Visit: Weight loss management and bilateral hand pain  HPI: Norma Soto is a 45 y.o. female who is coming in today for the above mentioned reasons.  She follows with me mainly for weight management issues.  She has lost an additional 4 pounds with combination of lifestyle changes and semaglutide she is now up to 0.50 mg weekly.  She works as a Charity fundraiser.  She spends a lot of time in the computer.  She has lately been complaining of bilateral, left greater than right hand pain.  Pain is along the wrist area in a mobile thumb, index, middle fingers primarily.  She can also sometimes have pain at the elbow or the forearm.  Past Medical/Surgical History: Past Medical History:  Diagnosis Date  . Abnormal Pap smear of cervix    many yrs ago, just repeat done  . ADHD (attention deficit hyperactivity disorder), inattentive type   . Dysmenorrhea   . Fibroid   . Headache   . Menorrhagia     Past Surgical History:  Procedure Laterality Date  . ABDOMINAL HYSTERECTOMY  2007   fibroid, heavy periods  . BREAST BIOPSY Right 10/2018   x2  . CESAREAN SECTION      Social History:  reports that she has never smoked. She has never used smokeless tobacco. She reports that she does not drink alcohol and does not use drugs.  Allergies: No Known Allergies  Family History:  Family History  Problem Relation Age of Onset  . Multiple myeloma Mother   . Hypertension Father   . Stroke Father   . Heart attack Father   . Heart disease Father      Current Outpatient Medications:  .  albuterol (VENTOLIN HFA) 108 (90 Base) MCG/ACT  inhaler, Inhale 2 puffs into the lungs every 6 (six) hours as needed for wheezing or shortness of breath., Disp: 18 g, Rfl: 6 .  ibuprofen (ADVIL) 800 MG tablet, Take 1 tablet (800 mg total) by mouth every 8 (eight) hours as needed for fever or headache., Disp: 90 tablet, Rfl: 2 .  lidocaine (LIDODERM) 5 %, Place 1 patch onto the skin daily. Remove & Discard patch within 12 hours or as directed by MD (Patient taking differently: Place 1 patch onto the skin daily as needed (pain). Remove & Discard patch within 12 hours or as directed by MD), Disp: 30 patch, Rfl: 4 .  Semaglutide,0.25 or 0.5MG/DOS, (OZEMPIC, 0.25 OR 0.5 MG/DOSE,) 2 MG/1.5ML SOPN, Inject 0.5 mg into the skin once a week., Disp: 1.5 mL, Rfl: 2 .  amitriptyline (ELAVIL) 10 MG tablet, TAKE 1-2 TABLETS (10-20 MG TOTAL) BY MOUTH AT BEDTIME. (Patient not taking: Reported on 02/28/2021), Disp: 180 tablet, Rfl: 3 .  amphetamine-dextroamphetamine (ADDERALL) 10 MG tablet, Take 1 tablet (10 mg total) by mouth 2 (two) times daily with a meal. (Patient not taking: Reported on 02/28/2021), Disp: 60 tablet, Rfl: 0  Review of Systems:  Constitutional: Denies fever, chills, diaphoresis, appetite change and fatigue.  HEENT: Denies photophobia, eye pain, redness, hearing loss, ear pain, congestion, sore throat, rhinorrhea, sneezing, mouth sores, trouble swallowing, neck pain, neck stiffness and tinnitus.   Respiratory:  Denies SOB, DOE, cough, chest tightness,  and wheezing.   Cardiovascular: Denies chest pain, palpitations and leg swelling.  Gastrointestinal: Denies nausea, vomiting, abdominal pain, diarrhea, constipation, blood in stool and abdominal distention.  Genitourinary: Denies dysuria, urgency, frequency, hematuria, flank pain and difficulty urinating.  Endocrine: Denies: hot or cold intolerance, sweats, changes in hair or nails, polyuria, polydipsia. Musculoskeletal: Denies myalgias, back pain, joint swelling, arthralgias and gait problem.  Skin:  Denies pallor, rash and wound.  Neurological: Denies dizziness, seizures, syncope, weakness, light-headedness, numbness and headaches.  Hematological: Denies adenopathy. Easy bruising, personal or family bleeding history  Psychiatric/Behavioral: Denies suicidal ideation, mood changes, confusion, nervousness, sleep disturbance and agitation    Physical Exam: Vitals:   02/28/21 1341  BP: 120/90  Pulse: 60  Temp: 98.2 F (36.8 C)  TempSrc: Oral  SpO2: 98%  Weight: 199 lb (90.3 kg)    Body mass index is 35.25 kg/m.   Constitutional: NAD, calm, comfortable, obese Eyes: PERRL, lids and conjunctivae normal ENMT: Mucous membranes are moist.  Respiratory: clear to auscultation bilaterally, no wheezing, no crackles. Normal respiratory effort. No accessory muscle use.  Cardiovascular: Regular rate and rhythm, no murmurs / rubs / gallops. No extremity edema.  Psychiatric: Normal judgment and insight. Alert and oriented x 3. Normal mood.    Impression and Plan:  Bilateral carpal tunnel syndrome -I suspect this to be the main etiology. -She is encouraged to get bilateral wrist splints and to wear them for at least 18 hours a day.  If no significant improvement can consider referral to sports medicine or orthopedics for further management.  Obesity (BMI 35.0-39.9 without comorbidity) -Discussed healthy lifestyle, including increased physical activity and better food choices to promote weight loss. -She will continue with Ozempic and healthy lifestyle changes.    Patient Instructions   -Nice seeing you today!!  -Wear wrist splints for at least 18 hours a day.  -Schedule follow up in 3 months.   Carpal Tunnel Syndrome  Carpal tunnel syndrome is a condition that causes pain, weakness, and numbness in your hand and arm. Numbness is when you cannot feel an area in your body. The carpal tunnel is a narrow area that is on the palm side of your wrist. Repeated wrist motion or certain  diseases may cause swelling in the tunnel. This swelling can pinch the main nerve in the wrist. This nerve is called the median nerve. What are the causes? This condition may be caused by:  Moving your hand and wrist over and over again while doing a task.  Injury to the wrist.  Arthritis.  A sac of fluid (cyst) or abnormal growth (tumor) in the carpal tunnel.  Fluid buildup during pregnancy.  Use of tools that vibrate. Sometimes the cause is not known. What increases the risk? The following factors may make you more likely to have this condition:  Having a job that makes you do these things: ? Move your hand over and over again. ? Work with tools that vibrate, such as drills or sanders.  Being a woman.  Having diabetes, obesity, thyroid problems, or kidney failure. What are the signs or symptoms? Symptoms of this condition include:  A tingling feeling in your fingers.  Tingling or loss of feeling in your hand.  Pain in your entire arm. This pain may get worse when you bend your wrist and elbow for a long time.  Pain in your wrist that goes up your arm to your shoulder.  Pain that goes   down into your palm or fingers.  Weakness in your hands. You may find it hard to grab and hold items. You may feel worse at night. How is this treated? This condition may be treated with:  Lifestyle changes. You will be asked to stop or change the activity that caused your problem.  Doing exercises and activities that make bones, muscles, and tendons stronger (physical therapy).  Learning how to use your hand again (occupational therapy).  Medicines for pain and swelling. You may have injections in your wrist.  A wrist splint or brace.  Surgery. Follow these instructions at home: If you have a splint or brace:  Wear the splint or brace as told by your doctor. Take it off only as told by your doctor.  Loosen the splint if your fingers: ? Tingle. ? Become numb. ? Turn cold  and blue.  Keep the splint or brace clean.  If the splint or brace is not waterproof: ? Do not let it get wet. ? Cover it with a watertight covering when you take a bath or a shower. Managing pain, stiffness, and swelling If told, put ice on the painful area:  If you have a removable splint or brace, remove it as told by your doctor.  Put ice in a plastic bag.  Place a towel between your skin and the bag.  Leave the ice on for 20 minutes, 2-3 times per day. Do not fall asleep with the cold pack on your skin.  Take off the ice if your skin turns bright red. This is very important. If you cannot feel pain, heat, or cold, you have a greater risk of damage to the area. Move your fingers often to reduce stiffness and swelling.   General instructions  Take over-the-counter and prescription medicines only as told by your doctor.  Rest your wrist from any activity that may cause pain. If needed, talk with your boss at work about changes that can help your wrist heal.  Do exercises as told by your doctor, physical therapist, or occupational therapist.  Keep all follow-up visits. Contact a doctor if:  You have new symptoms.  Medicine does not help your pain.  Your symptoms get worse. Get help right away if:  You have very bad numbness or tingling in your wrist or hand. Summary  Carpal tunnel syndrome is a condition that causes pain in your hand and arm.  It is often caused by repeated wrist motions.  Lifestyle changes and medicines are used to treat this problem. Surgery may help in very bad cases.  Follow your doctor's instructions about wearing a splint, resting your wrist, keeping follow-up visits, and calling for help. This information is not intended to replace advice given to you by your health care provider. Make sure you discuss any questions you have with your health care provider. Document Revised: 03/28/2020 Document Reviewed: 03/28/2020 Elsevier Patient Education   2021 Elsevier Inc.      Estela Hernandez Acosta, MD [facility]   

## 2021-03-19 ENCOUNTER — Encounter: Payer: Self-pay | Admitting: Internal Medicine

## 2021-03-19 DIAGNOSIS — M79643 Pain in unspecified hand: Secondary | ICD-10-CM

## 2021-04-03 ENCOUNTER — Encounter: Payer: Self-pay | Admitting: Internal Medicine

## 2021-04-03 ENCOUNTER — Ambulatory Visit (INDEPENDENT_AMBULATORY_CARE_PROVIDER_SITE_OTHER): Payer: BC Managed Care – PPO | Admitting: Neurology

## 2021-04-03 ENCOUNTER — Other Ambulatory Visit: Payer: Self-pay

## 2021-04-03 ENCOUNTER — Ambulatory Visit: Payer: BC Managed Care – PPO | Admitting: Neurology

## 2021-04-03 DIAGNOSIS — M79642 Pain in left hand: Secondary | ICD-10-CM

## 2021-04-03 DIAGNOSIS — M79641 Pain in right hand: Secondary | ICD-10-CM

## 2021-04-03 DIAGNOSIS — M2550 Pain in unspecified joint: Secondary | ICD-10-CM

## 2021-04-03 DIAGNOSIS — M5030 Other cervical disc degeneration, unspecified cervical region: Secondary | ICD-10-CM

## 2021-04-03 DIAGNOSIS — Z0289 Encounter for other administrative examinations: Secondary | ICD-10-CM

## 2021-04-03 DIAGNOSIS — M791 Myalgia, unspecified site: Secondary | ICD-10-CM

## 2021-04-03 DIAGNOSIS — G549 Nerve root and plexus disorder, unspecified: Secondary | ICD-10-CM
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
## 2021-04-06 NOTE — Procedures (Signed)
Full Name: Norma Soto Gender: Female MRN #: [id number] Date of Birth: 01-Aug-1976    Visit Date: 04/03/2021 07:06 Age: 45 Years Examining Physician: Sarina Ill, MD  Referring Physician: Isaac Bliss, Rayford Halsted, MD    History: Left > right hand pain  Summary: NCS was performed on the bilateral upper extremities.EMG was performed on the left upper extremity. All nerves and muscles (as indicated in the following tables) were within normal limits.    Conclusion: This is a normal study.  No electrophysiologic evidence for mononeuropathy (carpal tunnel, ulnar neuropathy), polyneuropathy, or cervical radiculopathy.  Sarina Ill, M.D.  [facility] [address], [place], [place] [address] Tel: [phone] Fax: [phone]  Verbal informed consent was obtained from the patient, patient was informed of potential risk of procedure, including bruising, bleeding, hematoma formation, infection, muscle weakness, muscle pain, numbness, among others.        [place]    Nerve / Sites Muscle Latency Ref. Amplitude Ref. Rel Amp Segments Distance Velocity Ref. Area    ms ms mV mV %  cm m/s m/s mVms  L Median - APB     Wrist APB 3.3 ?4.4 6.3 ?4.0 100 Wrist - APB 7   19.4     Upper arm APB 7.0  6.3  100 Upper arm - Wrist 22 59 ?49 19.8  R Median - APB     Wrist APB 3.3 ?4.4 9.1 ?4.0 100 Wrist - APB 7   31.3     Upper arm APB 7.1  9.0  98.6 Upper arm - Wrist 22 57 ?49 30.1  L Ulnar - ADM     Wrist ADM 2.8 ?3.3 11.3 ?6.0 100 Wrist - ADM 7   33.0     B.Elbow ADM 5.6  11.6  103 B.Elbow - Wrist 18 65 ?49 35.9     A.Elbow ADM 7.4  11.4  98.5 A.Elbow - B.Elbow 10 56 ?49 35.9  R Ulnar - ADM     Wrist ADM 2.5 ?3.3 10.0 ?6.0 100 Wrist - ADM 7   31.5     B.Elbow ADM 5.5  9.0  89.5 B.Elbow - Wrist 18 60 ?49 32.1     A.Elbow ADM 7.3  8.3  92.8 A.Elbow - B.Elbow 10 57 ?49 31.6             SNC    Nerve / Sites Rec. Site Peak Lat Ref.  Amp Ref. Segments Distance Peak  Diff Ref.    ms ms V V  cm ms ms  R Median, Ulnar - Transcarpal comparison     Median Palm Wrist 2.1 ?2.2 45 ?35 Median Palm - Wrist 8       Ulnar Palm Wrist 2.1 ?2.2 24 ?12 Ulnar Palm - Wrist 8          Median Palm - Ulnar Palm  0.0 ?0.4  L Median, Ulnar - Transcarpal comparison     Median Palm Wrist 2.0 ?2.2 77 ?35 Median Palm - Wrist 8       Ulnar Palm Wrist 2.0 ?2.2 21 ?12 Ulnar Palm - Wrist 8          Median Palm - Ulnar Palm  0.0 ?0.4  R Median - Orthodromic (Dig II, Mid palm)     Dig II Wrist 3.0 ?3.4 13 ?10 Dig II - Wrist 13    L Median - Orthodromic (Dig II, Mid palm)     Dig II  Wrist 2.8 ?3.4 26 ?10 Dig II - Wrist 13    R Ulnar - Orthodromic, (Dig V, Mid palm)     Dig V Wrist 2.9 ?3.1 9 ?5 Dig V - Wrist 11    L Ulnar - Orthodromic, (Dig V, Mid palm)     Dig V Wrist 2.7 ?3.1 9 ?5 Dig V - Wrist 61                   F  Wave    Nerve F Lat Ref.   ms ms  L Ulnar - ADM 28.2 ?32.0  R Ulnar - ADM 29.0 ?32.0         EMG Summary Table    Spontaneous MUAP Recruitment  Muscle IA Fib PSW Fasc Other Amp Dur. Poly Pattern  L. Deltoid Normal None None None _______ Normal Normal Normal Normal  L. Triceps brachii Normal None None None _______ Normal Normal Normal Normal  L. Biceps brachii Normal None None None _______ Normal Normal Normal Normal  L. Pronator teres Normal None None None _______ Normal Normal Normal Normal  L. First dorsal interosseous Normal None None None _______ Normal Normal Normal Normal  L. Opponens pollicis Normal None None None _______ Normal Normal Normal Normal  L. Cervical paraspinals (low) Normal None None None _______ Normal Normal Normal Normal

## 2021-04-06 NOTE — Progress Notes (Signed)
Full Name: Norma Soto Gender: Female MRN #: [id number] Date of Birth: [birth year]    Visit Date: 04/03/2021 07:06 Age: 45 Years Examining Physician: Sarina Ill, MD  Referring Physician: Isaac Bliss, Rayford Halsted, MD    History: Left > right hand pain  Summary: NCS was performed on the bilateral upper extremities.EMG was performed on the left upper extremity. All nerves and muscles (as indicated in the following tables) were within normal limits.    Conclusion: This is a normal study.  No electrophysiologic evidence for mononeuropathy (carpal tunnel, ulnar neuropathy), polyneuropathy, or cervical radiculopathy.  Sarina Ill, M.D.  [facility] [address] [place], [place] [address] Tel: [phone] Fax: [phone]  Verbal informed consent was obtained from the patient, patient was informed of potential risk of procedure, including bruising, bleeding, hematoma formation, infection, muscle weakness, muscle pain, numbness, among others.        [place]    Nerve / Sites Muscle Latency Ref. Amplitude Ref. Rel Amp Segments Distance Velocity Ref. Area    ms ms mV mV %  cm m/s m/s mVms  L Median - APB     Wrist APB 3.3 ?4.4 6.3 ?4.0 100 Wrist - APB 7   19.4     Upper arm APB 7.0  6.3  100 Upper arm - Wrist 22 59 ?49 19.8  R Median - APB     Wrist APB 3.3 ?4.4 9.1 ?4.0 100 Wrist - APB 7   31.3     Upper arm APB 7.1  9.0  98.6 Upper arm - Wrist 22 57 ?49 30.1  L Ulnar - ADM     Wrist ADM 2.8 ?3.3 11.3 ?6.0 100 Wrist - ADM 7   33.0     B.Elbow ADM 5.6  11.6  103 B.Elbow - Wrist 18 65 ?49 35.9     A.Elbow ADM 7.4  11.4  98.5 A.Elbow - B.Elbow 10 56 ?49 35.9  R Ulnar - ADM     Wrist ADM 2.5 ?3.3 10.0 ?6.0 100 Wrist - ADM 7   31.5     B.Elbow ADM 5.5  9.0  89.5 B.Elbow - Wrist 18 60 ?49 32.1     A.Elbow ADM 7.3  8.3  92.8 A.Elbow - B.Elbow 10 57 ?49 31.6             SNC    Nerve / Sites Rec. Site Peak Lat Ref.  Amp Ref. Segments Distance Peak  Diff Ref.    ms ms V V  cm ms ms  R Median, Ulnar - Transcarpal comparison     Median Palm Wrist 2.1 ?2.2 45 ?35 Median Palm - Wrist 8       Ulnar Palm Wrist 2.1 ?2.2 24 ?12 Ulnar Palm - Wrist 8          Median Palm - Ulnar Palm  0.0 ?0.4  L Median, Ulnar - Transcarpal comparison     Median Palm Wrist 2.0 ?2.2 77 ?35 Median Palm - Wrist 8       Ulnar Palm Wrist 2.0 ?2.2 21 ?12 Ulnar Palm - Wrist 8          Median Palm - Ulnar Palm  0.0 ?0.4  R Median - Orthodromic (Dig II, Mid palm)     Dig II Wrist 3.0 ?3.4 13 ?10 Dig II - Wrist 13    L Median - Orthodromic (Dig II, Mid palm)     Dig II  Wrist 2.8 ?3.4 26 ?10 Dig II - Wrist 13    R Ulnar - Orthodromic, (Dig V, Mid palm)     Dig V Wrist 2.9 ?3.1 9 ?5 Dig V - Wrist 11    L Ulnar - Orthodromic, (Dig V, Mid palm)     Dig V Wrist 2.7 ?3.1 9 ?5 Dig V - Wrist 61                   F  Wave    Nerve F Lat Ref.   ms ms  L Ulnar - ADM 28.2 ?32.0  R Ulnar - ADM 29.0 ?32.0         EMG Summary Table    Spontaneous MUAP Recruitment  Muscle IA Fib PSW Fasc Other Amp Dur. Poly Pattern  L. Deltoid Normal None None None _______ Normal Normal Normal Normal  L. Triceps brachii Normal None None None _______ Normal Normal Normal Normal  L. Biceps brachii Normal None None None _______ Normal Normal Normal Normal  L. Pronator teres Normal None None None _______ Normal Normal Normal Normal  L. First dorsal interosseous Normal None None None _______ Normal Normal Normal Normal  L. Opponens pollicis Normal None None None _______ Normal Normal Normal Normal  L. Cervical paraspinals (low) Normal None None None _______ Normal Normal Normal Normal

## 2021-04-06 NOTE — Progress Notes (Signed)
See procedure note.

## 2021-04-10 ENCOUNTER — Telehealth: Payer: Self-pay | Admitting: Neurology

## 2021-04-10 NOTE — Telephone Encounter (Signed)
Scheduled 04/15/21 at [facility] 45 mins MRI Cervical Spine w/wo contrast Dr. Ihor Dow [place] ref #[id number]

## 2021-04-11 ENCOUNTER — Ambulatory Visit: Payer: BC Managed Care – PPO

## 2021-04-15 ENCOUNTER — Ambulatory Visit (INDEPENDENT_AMBULATORY_CARE_PROVIDER_SITE_OTHER): Payer: BC Managed Care – PPO

## 2021-04-15 DIAGNOSIS — M79642 Pain in left hand: Secondary | ICD-10-CM

## 2021-04-15 DIAGNOSIS — M5030 Other cervical disc degeneration, unspecified cervical region: Secondary | ICD-10-CM | POA: Diagnosis not present

## 2021-04-15 DIAGNOSIS — G549 Nerve root and plexus disorder, unspecified: Secondary | ICD-10-CM | POA: Diagnosis not present

## 2021-04-15 DIAGNOSIS — M79641 Pain in right hand: Secondary | ICD-10-CM

## 2021-04-15 DIAGNOSIS — M791 Myalgia, unspecified site: Secondary | ICD-10-CM

## 2021-04-15 MED ORDER — GADOBENATE DIMEGLUMINE 529 MG/ML IV SOLN
20.0000 mL | Freq: Once | INTRAVENOUS | Status: AC | PRN
  Administered 2021-04-15: 20 mL via INTRAVENOUS

## 2021-04-17 LAB — MULTIPLE MYELOMA PANEL, SERUM
Albumin SerPl Elph-Mcnc: 3.4 g/dL (ref 2.9–4.4)
Albumin/Glob SerPl: 1 (ref 0.7–1.7)
Alpha 1: 0.3 g/dL (ref 0.0–0.4)
Alpha2 Glob SerPl Elph-Mcnc: 0.8 g/dL (ref 0.4–1.0)
B-Globulin SerPl Elph-Mcnc: 1.1 g/dL (ref 0.7–1.3)
Gamma Glob SerPl Elph-Mcnc: 1.5 g/dL (ref 0.4–1.8)
Globulin, Total: 3.7 g/dL (ref 2.2–3.9)
IgA/Immunoglobulin A, Serum: 395 mg/dL — ABNORMAL HIGH (ref 87–352)
IgG (Immunoglobin G), Serum: 1494 mg/dL (ref 586–1602)
IgM (Immunoglobulin M), Srm: 133 mg/dL (ref 26–217)

## 2021-04-17 LAB — CBC WITH DIFFERENTIAL/PLATELET
Basophils Absolute: 0 10*3/uL (ref 0.0–0.2)
Basos: 1 %
EOS (ABSOLUTE): 0.1 10*3/uL (ref 0.0–0.4)
Eos: 2 %
Hematocrit: 37.6 % (ref 34.0–46.6)
Hemoglobin: 12 g/dL (ref 11.1–15.9)
Immature Grans (Abs): 0 10*3/uL (ref 0.0–0.1)
Immature Granulocytes: 0 %
Lymphocytes Absolute: 1.3 10*3/uL (ref 0.7–3.1)
Lymphs: 38 %
MCH: 27 pg (ref 26.6–33.0)
MCHC: 31.9 g/dL (ref 31.5–35.7)
MCV: 85 fL (ref 79–97)
Monocytes Absolute: 0.2 10*3/uL (ref 0.1–0.9)
Monocytes: 6 %
Neutrophils Absolute: 1.7 10*3/uL (ref 1.4–7.0)
Neutrophils: 53 %
Platelets: 221 10*3/uL (ref 150–450)
RBC: 4.44 x10E6/uL (ref 3.77–5.28)
RDW: 12.9 % (ref 11.7–15.4)
WBC: 3.3 10*3/uL — ABNORMAL LOW (ref 3.4–10.8)

## 2021-04-17 LAB — COMPREHENSIVE METABOLIC PANEL
ALT: 13 IU/L (ref 0–32)
AST: 13 IU/L (ref 0–40)
Albumin/Globulin Ratio: 1.3 (ref 1.2–2.2)
Albumin: 4 g/dL (ref 3.8–4.8)
Alkaline Phosphatase: 63 IU/L (ref 44–121)
BUN/Creatinine Ratio: 10 (ref 9–23)
BUN: 7 mg/dL (ref 6–24)
Bilirubin Total: 0.2 mg/dL (ref 0.0–1.2)
CO2: 23 mmol/L (ref 20–29)
Calcium: 9.2 mg/dL (ref 8.7–10.2)
Chloride: 104 mmol/L (ref 96–106)
Creatinine, Ser: 0.71 mg/dL (ref 0.57–1.00)
Globulin, Total: 3.1 g/dL (ref 1.5–4.5)
Glucose: 77 mg/dL (ref 65–99)
Potassium: 3.9 mmol/L (ref 3.5–5.2)
Sodium: 141 mmol/L (ref 134–144)
Total Protein: 7.1 g/dL (ref 6.0–8.5)
eGFR: 107 mL/min/{1.73_m2} (ref >59)

## 2021-04-17 LAB — SJOGREN'S SYNDROME ANTIBODS(SSA + SSB)
ENA SSA (RO) Ab: <0.2 AI (ref 0.0–0.9)
ENA SSB (LA) Ab: <0.2 AI (ref 0.0–0.9)

## 2021-04-17 LAB — HEAVY METALS, BLOOD
Arsenic: 1 ug/L (ref 0–9)
Lead, Blood: <1 ug/dL (ref 0–4)
Mercury: <1 ug/L (ref 0.0–14.9)

## 2021-04-17 LAB — B12 AND FOLATE PANEL
Folate: 7.5 ng/mL (ref >3.0)
Vitamin B-12: 270 pg/mL (ref 232–1245)

## 2021-04-17 LAB — METHYLMALONIC ACID, SERUM: Methylmalonic Acid: 90 nmol/L (ref 0–378)

## 2021-04-17 LAB — MAGNESIUM: Magnesium: 1.9 mg/dL (ref 1.6–2.3)

## 2021-04-17 LAB — VITAMIN B6: Vitamin B6: 5.1 ug/L (ref 3.4–65.2)

## 2021-04-17 LAB — VITAMIN B1: Thiamine: 61.6 nmol/L — ABNORMAL LOW (ref 66.5–200.0)

## 2021-04-17 LAB — ANTINUCLEAR ANTIBODIES, IFA: ANA Titer 1: NEGATIVE

## 2021-04-17 LAB — TSH: TSH: 1.03 u[IU]/mL (ref 0.450–4.500)

## 2021-04-17 LAB — CK: Total CK: 103 U/L (ref 32–182)

## 2021-04-17 LAB — ANA W/REFLEX: Anti Nuclear Antibody (ANA): NEGATIVE

## 2021-04-17 LAB — SEDIMENTATION RATE: Sed Rate: 23 mm/hr (ref 0–32)

## 2021-04-18 ENCOUNTER — Encounter: Payer: Self-pay | Admitting: Internal Medicine

## 2021-04-18 DIAGNOSIS — E669 Obesity, unspecified: Secondary | ICD-10-CM

## 2021-04-18 MED ORDER — OZEMPIC (1 MG/DOSE) 2 MG/1.5ML ~~LOC~~ SOPN: 1.0000 mg | PEN_INJECTOR | SUBCUTANEOUS | 1 refills | Status: AC

## 2021-05-22 ENCOUNTER — Other Ambulatory Visit: Payer: Self-pay

## 2021-05-23 ENCOUNTER — Encounter: Payer: Self-pay | Admitting: Internal Medicine

## 2021-05-23 ENCOUNTER — Ambulatory Visit: Payer: BC Managed Care – PPO | Admitting: Internal Medicine

## 2021-05-23 VITALS — BP 114/70 | HR 96 | Temp 99.0°F | Ht 63.0 in | Wt 196.3 lb

## 2021-05-23 DIAGNOSIS — E669 Obesity, unspecified: Secondary | ICD-10-CM

## 2021-05-23 DIAGNOSIS — F900 Attention-deficit hyperactivity disorder, predominantly inattentive type: Secondary | ICD-10-CM | POA: Diagnosis not present

## 2021-05-23 NOTE — Progress Notes (Signed)
Established Patient Office Visit     This visit occurred during the SARS-CoV-2 public health emergency.  Safety protocols were in place, including screening questions prior to the visit, additional usage of staff PPE, and extensive cleaning of exam room while observing appropriate contact time as indicated for disinfecting solutions.    CC/Reason for Visit: Follow-up chronic conditions  HPI: Norma Soto is a 45 y.o. female who is coming in today for the above mentioned reasons. Past Medical History is significant for: Obesity, ADHD not currently on medication.  She has been prescribed semaglutide for weight loss.  This is the main reason why I see her today.  She just increased from 0.5 to 1 mg dose last week.  She has lost an additional 6 pounds since I last saw her.  She believes she is having some perimenopausal symptoms.  She describes hot flashes and some night sweats.  She saw neurology from some left arm pain, had EMG that did not show evidence of carpal tunnel, she also had an MRI of her cervical spine that was normal.   Past Medical/Surgical History: Past Medical History:  Diagnosis Date   Abnormal Pap smear of cervix    many yrs ago, just repeat done   ADHD (attention deficit hyperactivity disorder), inattentive type    Dysmenorrhea    Fibroid    Headache    Menorrhagia     Past Surgical History:  Procedure Laterality Date   ABDOMINAL HYSTERECTOMY  2007   fibroid, heavy periods   BREAST BIOPSY Right 10/2018   x2   CESAREAN SECTION      Social History:  reports that she has never smoked. She has never used smokeless tobacco. She reports that she does not drink alcohol and does not use drugs.  Allergies: No Known Allergies  Family History:  Family History  Problem Relation Age of Onset   Multiple myeloma Mother    Hypertension Father    Stroke Father    Heart attack Father    Heart disease Father      Current Outpatient Medications:     ibuprofen (ADVIL) 800 MG tablet, Take 1 tablet (800 mg total) by mouth every 8 (eight) hours as needed for fever or headache., Disp: 90 tablet, Rfl: 2   albuterol (VENTOLIN HFA) 108 (90 Base) MCG/ACT inhaler, Inhale 2 puffs into the lungs every 6 (six) hours as needed for wheezing or shortness of breath. (Patient not taking: Reported on 05/23/2021), Disp: 18 g, Rfl: 6   amitriptyline (ELAVIL) 10 MG tablet, TAKE 1-2 TABLETS (10-20 MG TOTAL) BY MOUTH AT BEDTIME. (Patient not taking: No sig reported), Disp: 180 tablet, Rfl: 3   amphetamine-dextroamphetamine (ADDERALL) 10 MG tablet, Take 1 tablet (10 mg total) by mouth 2 (two) times daily with a meal. (Patient not taking: No sig reported), Disp: 60 tablet, Rfl: 0   lidocaine (LIDODERM) 5 %, Place 1 patch onto the skin daily. Remove & Discard patch within 12 hours or as directed by MD (Patient not taking: Reported on 05/23/2021), Disp: 30 patch, Rfl: 4   Semaglutide, 1 MG/DOSE, (OZEMPIC, 1 MG/DOSE,) 2 MG/1.5ML SOPN, Inject 1 mg into the skin once a week. (Patient not taking: Reported on 05/23/2021), Disp: 9 mL, Rfl: 1  Review of Systems:  Constitutional: Denies fever, chills, diaphoresis, appetite change and fatigue.  HEENT: Denies photophobia, eye pain, redness, hearing loss, ear pain, congestion, sore throat, rhinorrhea, sneezing, mouth sores, trouble swallowing, neck pain, neck stiffness and tinnitus.  Respiratory: Denies SOB, DOE, cough, chest tightness,  and wheezing.   Cardiovascular: Denies chest pain, palpitations and leg swelling.  Gastrointestinal: Denies nausea, vomiting, abdominal pain, diarrhea, constipation, blood in stool and abdominal distention.  Genitourinary: Denies dysuria, urgency, frequency, hematuria, flank pain and difficulty urinating.  Endocrine: Denies: hot or cold intolerance, sweats, changes in hair or nails, polyuria, polydipsia. Musculoskeletal: Denies myalgias, back pain, joint swelling, arthralgias and gait problem.  Skin:  Denies pallor, rash and wound.  Neurological: Denies dizziness, seizures, syncope, weakness, light-headedness, numbness and headaches.  Hematological: Denies adenopathy. Easy bruising, personal or family bleeding history  Psychiatric/Behavioral: Denies suicidal ideation, mood changes, confusion, nervousness, sleep disturbance and agitation    Physical Exam: Vitals:   05/23/21 1120  BP: 114/70  Pulse: 96  Temp: 99 F (37.2 C)  TempSrc: Oral  SpO2: 100%  Weight: 196 lb 4.8 oz (89 kg)  Height: 5' 3"  (1.6 m)    Body mass index is 34.77 kg/m.   Constitutional: NAD, calm, comfortable Eyes: PERRL, lids and conjunctivae normal ENMT: Mucous membranes are moist.  Respiratory: clear to auscultation bilaterally, no wheezing, no crackles. Normal respiratory effort. No accessory muscle use.  Cardiovascular: Regular rate and rhythm, no murmurs / rubs / gallops. No extremity edema. 2 Neurologic: Grossly intact and nonfocal Psychiatric: Normal judgment and insight. Alert and oriented x 3. Normal mood.    Impression and Plan:  Obesity (BMI 30.0-34.9) -She has been congratulated on her weight success thus far. -Continue semaglutide 1 mg weekly for now, consider increasing dose next visit.   Time spent: 31 minutes reviewing, interviewing and examining patient and formulating plan of care.     Lelon Frohlich, MD  Primary Care at [facility]

## 2021-06-06 ENCOUNTER — Ambulatory Visit: Payer: BC Managed Care – PPO | Admitting: Internal Medicine

## 2021-06-09 ENCOUNTER — Other Ambulatory Visit: Payer: Self-pay | Admitting: Neurology

## 2021-06-09 DIAGNOSIS — E538 Deficiency of other specified B group vitamins: Secondary | ICD-10-CM

## 2021-06-09 DIAGNOSIS — E519 Thiamine deficiency, unspecified: Secondary | ICD-10-CM

## 2021-06-10 ENCOUNTER — Ambulatory Visit
Admission: RE | Admit: 2021-06-10 | Discharge: 2021-06-10 | Disposition: A | Discharge status: Home / Self Care | Payer: BC Managed Care – PPO | Source: Ambulatory Visit | Attending: Internal Medicine | Admitting: Internal Medicine

## 2021-06-10 ENCOUNTER — Other Ambulatory Visit: Payer: Self-pay

## 2021-06-10 DIAGNOSIS — Z1231 Encounter for screening mammogram for malignant neoplasm of breast: Secondary | ICD-10-CM | POA: Diagnosis not present

## 2021-06-12 ENCOUNTER — Other Ambulatory Visit (INDEPENDENT_AMBULATORY_CARE_PROVIDER_SITE_OTHER): Payer: Self-pay

## 2021-06-12 ENCOUNTER — Other Ambulatory Visit: Payer: Self-pay | Admitting: Neurology

## 2021-06-12 DIAGNOSIS — E538 Deficiency of other specified B group vitamins: Secondary | ICD-10-CM | POA: Diagnosis not present

## 2021-06-12 DIAGNOSIS — E519 Thiamine deficiency, unspecified: Secondary | ICD-10-CM | POA: Diagnosis not present

## 2021-06-12 DIAGNOSIS — Z0289 Encounter for other administrative examinations: Secondary | ICD-10-CM

## 2021-06-13 ENCOUNTER — Ambulatory Visit: Payer: BC Managed Care – PPO

## 2021-06-18 LAB — VITAMIN B12: Vitamin B-12: 834 pg/mL (ref 232–1245)

## 2021-06-18 LAB — VITAMIN B1: Thiamine: 134.3 nmol/L (ref 66.5–200.0)

## 2021-07-11 ENCOUNTER — Encounter: Payer: Self-pay | Admitting: Obstetrics & Gynecology

## 2021-07-11 ENCOUNTER — Ambulatory Visit (INDEPENDENT_AMBULATORY_CARE_PROVIDER_SITE_OTHER): Payer: BC Managed Care – PPO | Admitting: Obstetrics & Gynecology

## 2021-07-11 ENCOUNTER — Other Ambulatory Visit: Payer: Self-pay

## 2021-07-11 VITALS — BP 114/70 | HR 78 | Resp 16 | Ht 61.75 in | Wt 193.0 lb

## 2021-07-11 DIAGNOSIS — Z01419 Encounter for gynecological examination (general) (routine) without abnormal findings: Secondary | ICD-10-CM

## 2021-07-11 DIAGNOSIS — Z6835 Body mass index (BMI) 35.0-35.9, adult: Secondary | ICD-10-CM

## 2021-07-11 DIAGNOSIS — Z90710 Acquired absence of both cervix and uterus: Secondary | ICD-10-CM

## 2021-07-11 NOTE — Progress Notes (Signed)
Norma Soto 07-25-1976 [id number]   History:    45 y.o. G2P2L2 Single  RP:  Established patient presenting for annual gyn exam   HPI: S/P Total Hysterectomy at age 45 for Heavy menses/Fibroids.  Denies any vaginal bleeding or vaginal dryness. No partner change or STD concerns.  Pap Neg 03/2020.  STI screen Neg 03/2020. Breasts normal.  Mammo 05/2021 Neg.  BMI 35.59.  Has screening labs with PCP.   Past medical history,surgical history, family history and social history were all reviewed and documented in the EPIC chart.  Gynecologic History No LMP recorded. Patient has had a hysterectomy.  Obstetric History OB History  Gravida Para Term Preterm AB Living  '2 2 2     2  '$ SAB IAB Ectopic Multiple Live Births               # Outcome Date GA Lbr Len/2nd Weight Sex Delivery Anes PTL Lv  2 Term           1 Term              ROS: A ROS was performed and pertinent positives and negatives are included in the history.  GENERAL: No fevers or chills. HEENT: No change in vision, no earache, sore throat or sinus congestion. NECK: No pain or stiffness. CARDIOVASCULAR: No chest pain or pressure. No palpitations. PULMONARY: No shortness of breath, cough or wheeze. GASTROINTESTINAL: No abdominal pain, nausea, vomiting or diarrhea, melena or bright red blood per rectum. GENITOURINARY: No urinary frequency, urgency, hesitancy or dysuria. MUSCULOSKELETAL: No joint or muscle pain, no back pain, no recent trauma. DERMATOLOGIC: No rash, no itching, no lesions. ENDOCRINE: No polyuria, polydipsia, no heat or cold intolerance. No recent change in weight. HEMATOLOGICAL: No anemia or easy bruising or bleeding. NEUROLOGIC: No headache, seizures, numbness, tingling or weakness. PSYCHIATRIC: No depression, no loss of interest in normal activity or change in sleep pattern.     Exam:   BP 114/70   Pulse 78   Resp 16   Ht 5' 1.75" (1.568 m)   Wt 193 lb (87.5 kg)   BMI 35.59 kg/m   Body mass index is  35.59 kg/m.  General appearance : Well developed well nourished female. No acute distress HEENT: Eyes: no retinal hemorrhage or exudates,  Neck supple, trachea midline, no carotid bruits, no thyroidmegaly Lungs: Clear to auscultation, no rhonchi or wheezes, or rib retractions  Heart: Regular rate and rhythm, no murmurs or gallops Breast:Examined in sitting and supine position were symmetrical in appearance, no palpable masses or tenderness,  no skin retraction, no nipple inversion, no nipple discharge, no skin discoloration, no axillary or supraclavicular lymphadenopathy Abdomen: no palpable masses or tenderness, no rebound or guarding Extremities: no edema or skin discoloration or tenderness  Pelvic: Vulva: Normal             Vagina: No gross lesions or discharge  Cervix/Uterus absent  Adnexa  Without masses or tenderness  Anus: Normal   Assessment/Plan:  45 y.o. female for annual exam   1. Well female exam with routine gynecological exam Gynecologic exam status post total hysterectomy.  No indication for Pap test at this time.  Breast exam normal.  Last screening mammogram July 2022 was negative.  Health labs with family physician.  2. S/P total hysterectomy  3. Class 2 severe obesity due to excess calories with serious comorbidity and body mass index (BMI) of 35.0 to 35.9 in adult [facility])  Recommend a lower calorie/carb  diet.  Aerobic activities 5 times a week and light weightlifting every 2 days.  Princess Bruins MD, 9:44 AM 07/11/2021

## 2021-07-13 ENCOUNTER — Encounter: Payer: Self-pay | Admitting: Obstetrics & Gynecology

## 2021-07-30 ENCOUNTER — Encounter: Payer: Self-pay | Admitting: Internal Medicine

## 2021-07-30 ENCOUNTER — Other Ambulatory Visit: Payer: Self-pay | Admitting: Neurology

## 2021-07-30 MED ORDER — IBUPROFEN 800 MG PO TABS: 800.0000 mg | ORAL_TABLET | Freq: Three times a day (TID) | ORAL | 3 refills | Status: AC | PRN

## 2021-07-30 MED ORDER — OZEMPIC (0.25 OR 0.5 MG/DOSE) 2 MG/1.5ML ~~LOC~~ SOPN: 0.5000 mg | PEN_INJECTOR | SUBCUTANEOUS | 1 refills | Status: DC

## 2021-07-30 NOTE — Telephone Encounter (Signed)
0.25 or 0.5 for refill?

## 2021-10-10 ENCOUNTER — Telehealth: Payer: Self-pay | Admitting: *Deleted

## 2021-10-10 NOTE — Telephone Encounter (Signed)
PA started for Ozempic Key: [id number]

## 2021-10-16 NOTE — Telephone Encounter (Signed)
Ria Comment is calling please use Key   [id number] when you callback for further detail

## 2021-10-29 ENCOUNTER — Telehealth: Payer: BC Managed Care – PPO | Admitting: Physician Assistant

## 2021-10-29 DIAGNOSIS — J0100 Acute maxillary sinusitis, unspecified: Secondary | ICD-10-CM | POA: Diagnosis not present

## 2021-10-29 MED ORDER — AMOXICILLIN-POT CLAVULANATE 875-125 MG PO TABS: 1.0000 | ORAL_TABLET | Freq: Two times a day (BID) | ORAL | 0 refills | Status: DC

## 2021-10-29 MED ORDER — FLUTICASONE PROPIONATE 50 MCG/ACT NA SUSP: 2.0000 | Freq: Every day | NASAL | 0 refills | Status: DC

## 2021-10-29 NOTE — Patient Instructions (Addendum)
Zara Chess, thank you for joining [name], PA-C for today's virtual visit.  While this provider is not your primary care provider (PCP), if your PCP is located in our provider database this encounter information will be shared with them immediately following your visit.  Consent: (Patient) Norma Soto provided verbal consent for this virtual visit at the beginning of the encounter.  Current Medications:  Current Outpatient Medications:    albuterol (VENTOLIN HFA) 108 (90 Base) MCG/ACT inhaler, Inhale 2 puffs into the lungs every 6 (six) hours as needed for wheezing or shortness of breath., Disp: 18 g, Rfl: 6   ibuprofen (ADVIL) 800 MG tablet, Take 1 tablet (800 mg total) by mouth every 8 (eight) hours as needed for fever or headache., Disp: 45 tablet, Rfl: 3   Semaglutide,0.25 or 0.5MG /DOS, (OZEMPIC, 0.25 OR 0.5 MG/DOSE,) 2 MG/1.5ML SOPN, Inject 0.5 mg into the skin every 7 (seven) days., Disp: 1.5 mL, Rfl: 1   Thiamine HCl (B-1 PO), Take by mouth daily., Disp: , Rfl:    vitamin B-12 (CYANOCOBALAMIN) 1000 MCG tablet, Take 1,000 mcg by mouth daily., Disp: , Rfl:    Medications ordered in this encounter:  No orders of the defined types were placed in this encounter.    *If you need refills on other medications prior to your next appointment, please contact your pharmacy*  Follow-Up: Call back or seek an in-person evaluation if the symptoms worsen or if the condition fails to improve as anticipated.  Other Instructions Please take antibiotic as directed.  Increase fluid intake.  Use Saline nasal spray.  Take a daily multivitamin. Ok to continue Mucinex.   Place a humidifier in the bedroom.  Please call or return clinic if symptoms are not improving.  Sinusitis Sinusitis is redness, soreness, and swelling (inflammation) of the paranasal sinuses. Paranasal sinuses are air pockets within the bones of your face (beneath the eyes, the middle of the forehead, or  above the eyes). In healthy paranasal sinuses, mucus is able to drain out, and air is able to circulate through them by way of your nose. However, when your paranasal sinuses are inflamed, mucus and air can become trapped. This can allow bacteria and other germs to grow and cause infection. Sinusitis can develop quickly and last only a short time (acute) or continue over a long period (chronic). Sinusitis that lasts for more than 12 weeks is considered chronic.  CAUSES  Causes of sinusitis include: Allergies. Structural abnormalities, such as displacement of the cartilage that separates your nostrils (deviated septum), which can decrease the air flow through your nose and sinuses and affect sinus drainage. Functional abnormalities, such as when the small hairs (cilia) that line your sinuses and help remove mucus do not work properly or are not present. SYMPTOMS  Symptoms of acute and chronic sinusitis are the same. The primary symptoms are pain and pressure around the affected sinuses. Other symptoms include: Upper toothache. Earache. Headache. Bad breath. Decreased sense of smell and taste. A cough, which worsens when you are lying flat. Fatigue. Fever. Thick drainage from your nose, which often is green and may contain pus (purulent). Swelling and warmth over the affected sinuses. DIAGNOSIS  Your caregiver will perform a physical exam. During the exam, your caregiver may: Look in your nose for signs of abnormal growths in your nostrils (nasal polyps). Tap over the affected sinus to check for signs of infection. View the inside of your sinuses (endoscopy) with a special imaging device with  a light attached (endoscope), which is inserted into your sinuses. If your caregiver suspects that you have chronic sinusitis, one or more of the following tests may be recommended: Allergy tests. Nasal culture A sample of mucus is taken from your nose and sent to a lab and screened for bacteria. Nasal  cytology A sample of mucus is taken from your nose and examined by your caregiver to determine if your sinusitis is related to an allergy. TREATMENT  Most cases of acute sinusitis are related to a viral infection and will resolve on their own within 10 days. Sometimes medicines are prescribed to help relieve symptoms (pain medicine, decongestants, nasal steroid sprays, or saline sprays).  However, for sinusitis related to a bacterial infection, your caregiver will prescribe antibiotic medicines. These are medicines that will help kill the bacteria causing the infection.  Rarely, sinusitis is caused by a fungal infection. In theses cases, your caregiver will prescribe antifungal medicine. For some cases of chronic sinusitis, surgery is needed. Generally, these are cases in which sinusitis recurs more than 3 times per year, despite other treatments. HOME CARE INSTRUCTIONS  Drink plenty of water. Water helps thin the mucus so your sinuses can drain more easily. Use a humidifier. Inhale steam 3 to 4 times a day (for example, sit in the bathroom with the shower running). Apply a warm, moist washcloth to your face 3 to 4 times a day, or as directed by your caregiver. Use saline nasal sprays to help moisten and clean your sinuses. Take over-the-counter or prescription medicines for pain, discomfort, or fever only as directed by your caregiver. SEEK IMMEDIATE MEDICAL CARE IF: You have increasing pain or severe headaches. You have nausea, vomiting, or drowsiness. You have swelling around your face. You have vision problems. You have a stiff neck. You have difficulty breathing. MAKE SURE YOU:  Understand these instructions. Will watch your condition. Will get help right away if you are not doing well or get worse. Document Released: 11/16/2005 Document Revised: 02/08/2012 Document Reviewed: 12/01/2011 [facility] Patient Information 2014 [place], [place].  If you have been instructed to have an  in-person evaluation today at a local Urgent Care facility, please use the link below. It will take you to a list of all of our available [facility], including address, phone number and hours of operation. Please do not delay care.  [facility]  If you or a family member do not have a primary care provider, use the link below to schedule a visit and establish care. When you choose a [facility] primary care physician or advanced practice provider, you gain a long-term partner in health. Find a Primary Care Provider  Learn more about [place]'s in-office and virtual care options: [facility]
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
## 2021-10-31 NOTE — Telephone Encounter (Signed)
New form will be faxed

## 2021-11-06 NOTE — Telephone Encounter (Signed)
PA denied.

## 2021-11-25 IMAGING — MG DIGITAL SCREENING BILAT W/ TOMO W/ CAD
6 of 10 series · 6 of 30 positions shown · non-contrast
Comparison: Previous exam(s).

CLINICAL DATA: Screening.

EXAM:
DIGITAL SCREENING BILATERAL MAMMOGRAM WITH TOMO AND CAD

[R CC synth-2D (1 of 2)]
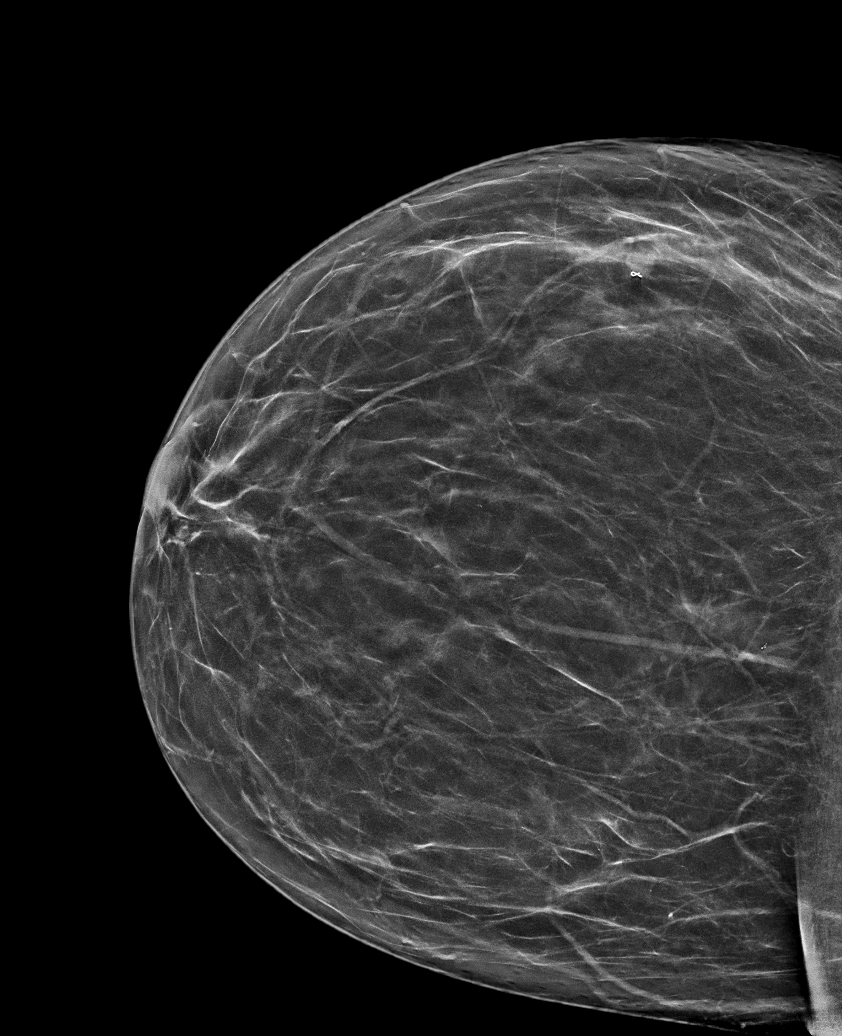

[R CC synth-2D (2 of 2)]
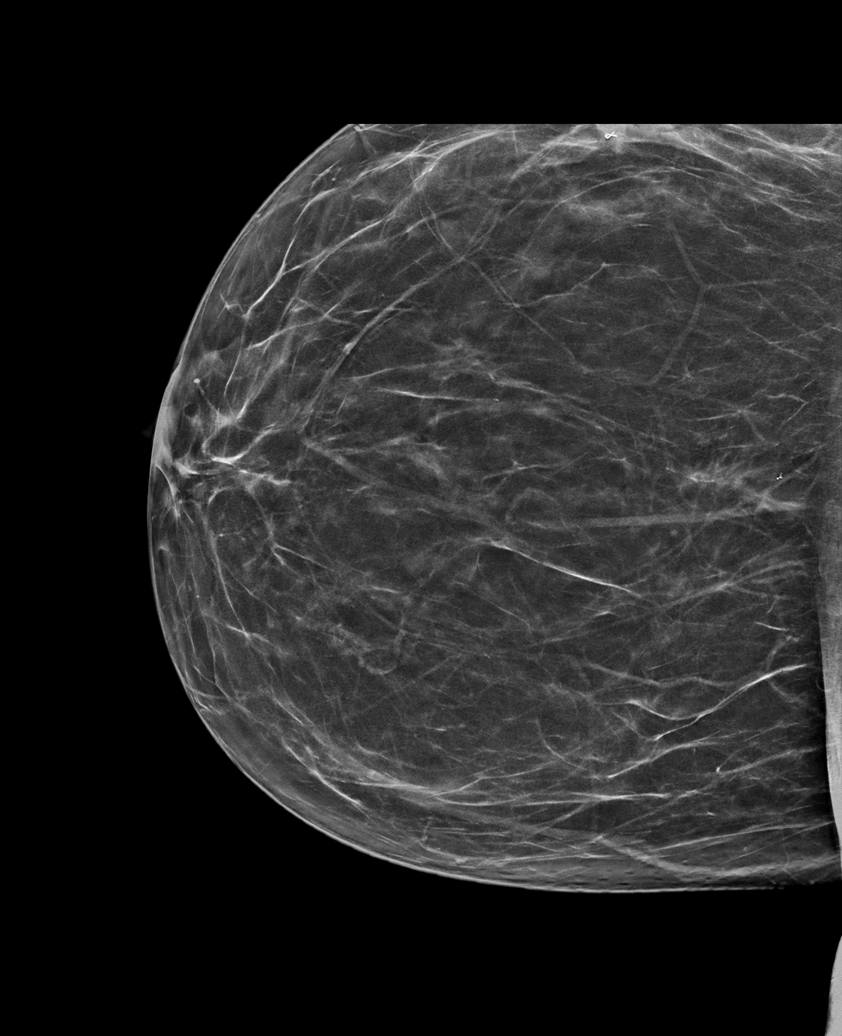

[R MLO synth-2D]
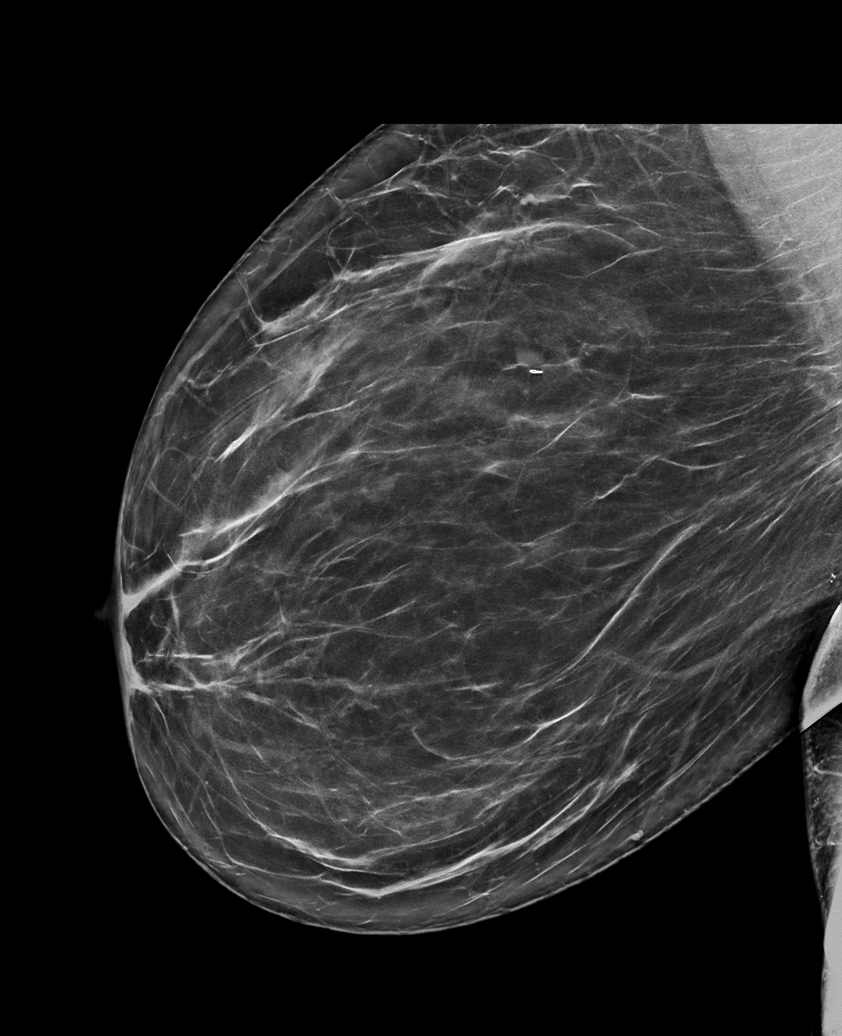

[L CC synth-2D]
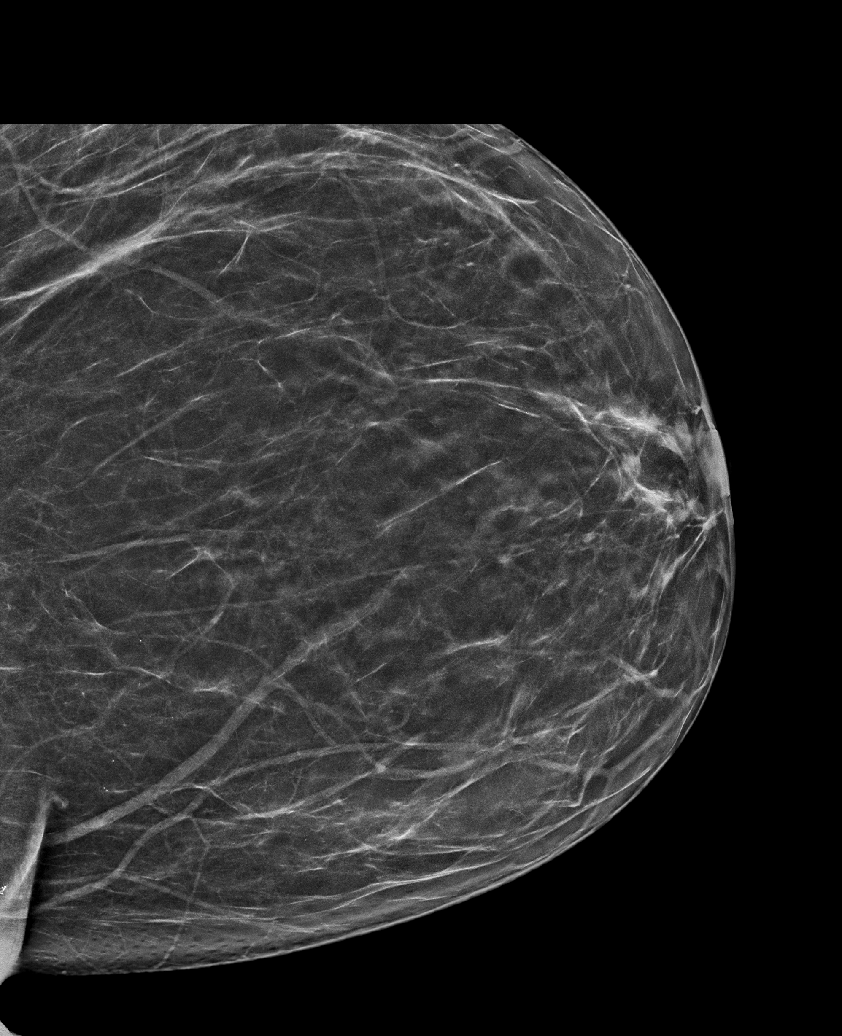

[L MLO synth-2D]
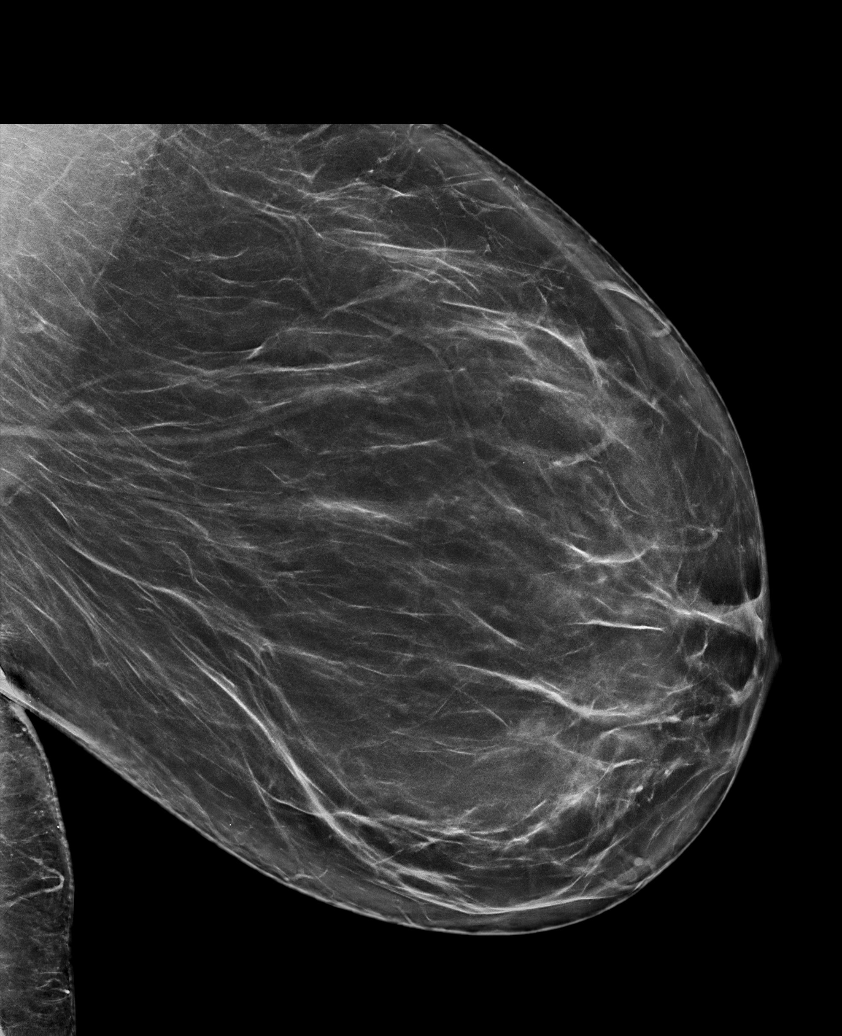

[L MLO tomo · tomo slice 46/91.0]
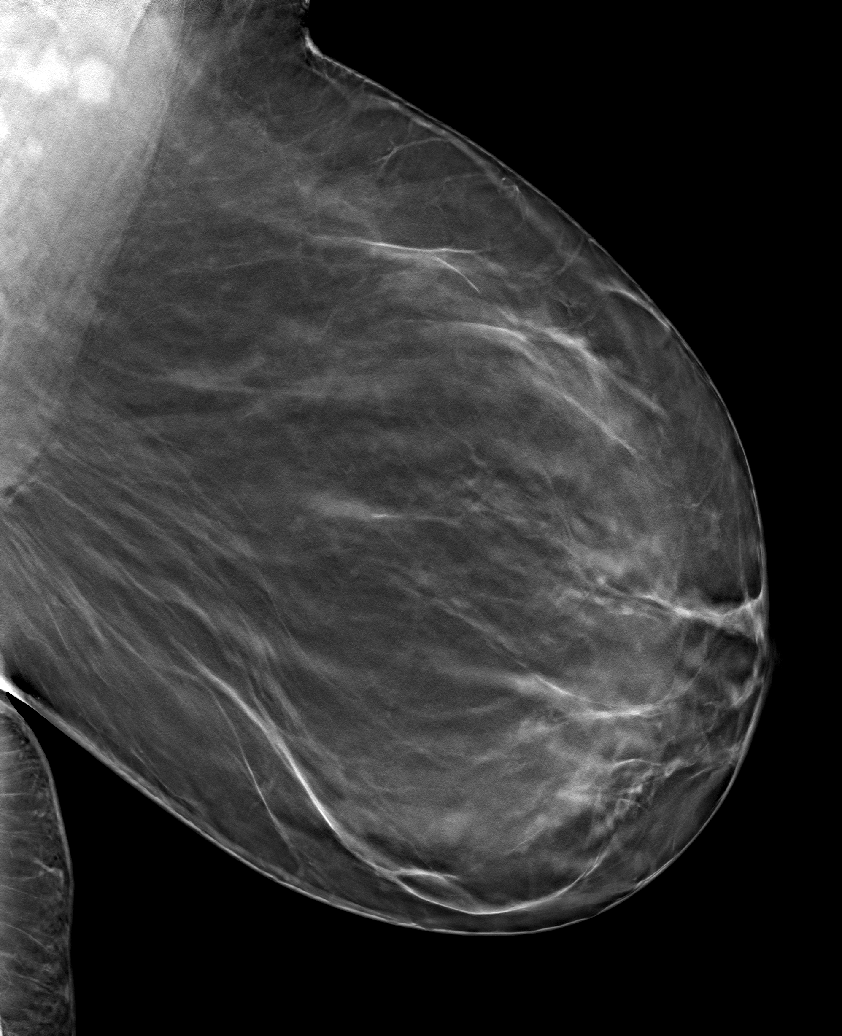

[6 of 30 positions shown; findings below may reference images not displayed]

ACR Breast Density Category b: There are scattered areas of
fibroglandular density.
FINDINGS: There are no findings suspicious for malignancy. Images were
processed with CAD.
IMPRESSION: No mammographic evidence of malignancy. A result letter of this
screening mammogram will be mailed directly to the patient.

RECOMMENDATION:
Screening mammogram in one year. (Code:[id number])

BI-RADS CATEGORY  1: Negative.

## 2021-12-14 ENCOUNTER — Encounter (HOSPITAL_COMMUNITY): Payer: Self-pay | Admitting: Emergency Medicine

## 2021-12-14 ENCOUNTER — Emergency Department (HOSPITAL_COMMUNITY)
Admission: EM | Admit: 2021-12-14 | Discharge: 2021-12-14 | Disposition: A | Discharge status: Home / Self Care | Payer: BC Managed Care – PPO | Attending: Emergency Medicine | Admitting: Emergency Medicine

## 2021-12-14 DIAGNOSIS — R42 Dizziness and giddiness: Secondary | ICD-10-CM | POA: Diagnosis not present

## 2021-12-14 DIAGNOSIS — Z79899 Other long term (current) drug therapy: Secondary | ICD-10-CM | POA: Diagnosis not present

## 2021-12-14 DIAGNOSIS — I10 Essential (primary) hypertension: Secondary | ICD-10-CM | POA: Diagnosis not present

## 2021-12-14 LAB — CBC WITH DIFFERENTIAL/PLATELET
Abs Immature Granulocytes: 0 10*3/uL (ref 0.00–0.07)
Basophils Absolute: 0 10*3/uL (ref 0.0–0.1)
Basophils Relative: 1 %
Eosinophils Absolute: 0.1 10*3/uL (ref 0.0–0.5)
Eosinophils Relative: 2 %
HCT: 38.1 % (ref 36.0–46.0)
Hemoglobin: 12.6 g/dL (ref 12.0–15.0)
Immature Granulocytes: 0 %
Lymphocytes Relative: 29 %
Lymphs Abs: 1.2 10*3/uL (ref 0.7–4.0)
MCH: 27.6 pg (ref 26.0–34.0)
MCHC: 33.1 g/dL (ref 30.0–36.0)
MCV: 83.4 fL (ref 80.0–100.0)
Monocytes Absolute: 0.3 10*3/uL (ref 0.1–1.0)
Monocytes Relative: 8 %
Neutro Abs: 2.4 10*3/uL (ref 1.7–7.7)
Neutrophils Relative %: 60 %
Platelets: 193 10*3/uL (ref 150–400)
RBC: 4.57 MIL/uL (ref 3.87–5.11)
RDW: 13 % (ref 11.5–15.5)
WBC: 4 10*3/uL (ref 4.0–10.5)
nRBC: 0 % (ref 0.0–0.2)

## 2021-12-14 LAB — BASIC METABOLIC PANEL
Anion gap: 3 — ABNORMAL LOW (ref 5–15)
BUN: 14 mg/dL (ref 6–20)
CO2: 25 mmol/L (ref 22–32)
Calcium: 8.6 mg/dL — ABNORMAL LOW (ref 8.9–10.3)
Chloride: 108 mmol/L (ref 98–111)
Creatinine, Ser: 0.61 mg/dL (ref 0.44–1.00)
GFR, Estimated: >60 mL/min (ref >60)
Glucose, Bld: 108 mg/dL — ABNORMAL HIGH (ref 70–99)
Potassium: 3.8 mmol/L (ref 3.5–5.1)
Sodium: 136 mmol/L (ref 135–145)

## 2021-12-14 MED ORDER — MECLIZINE HCL 25 MG PO TABS
25.0000 mg | ORAL_TABLET | Freq: Once | ORAL | Status: AC
  Administered 2021-12-14: 25 mg via ORAL
  Filled 2021-12-14: qty 1

## 2021-12-14 MED ORDER — MECLIZINE HCL 25 MG PO TABS: 25.0000 mg | ORAL_TABLET | Freq: Three times a day (TID) | ORAL | 0 refills | Status: DC | PRN

## 2021-12-14 MED ORDER — ONDANSETRON 4 MG PO TBDP
4.0000 mg | ORAL_TABLET | Freq: Once | ORAL | Status: AC
  Administered 2021-12-14: 4 mg via ORAL
  Filled 2021-12-14: qty 1

## 2021-12-14 MED ORDER — ONDANSETRON 4 MG PO TBDP: 4.0000 mg | ORAL_TABLET | Freq: Three times a day (TID) | ORAL | 0 refills | Status: DC | PRN

## 2021-12-14 NOTE — Discharge Instructions (Addendum)
You came to the emergency department today to be evaluated for your dizziness.  Your dizziness improved after receiving meclizine.  Your neurological exam was reassuring.  I suspect that your dizziness may be due to vertigo.  Please follow-up with your neurologist for further assessment in the outpatient setting.  While in the emergency department your blood pressure was found to be elevated.  Please record your blood pressures daily and follow-up with your primary care provider for further assessment of your blood pressure.  Get help right away if: You vomit or have diarrhea and are unable to eat or drink anything. You have problems talking, walking, swallowing, or using your arms, hands, or legs. You feel generally weak. You have any bleeding. You are not thinking clearly or you have trouble forming sentences. It may take a friend or family member to notice this. You have chest pain, abdominal pain, shortness of breath, or sweating. Your vision changes or you develop a severe headache.

## 2021-12-14 NOTE — ED Provider Notes (Addendum)
[facility] DEPT Provider Note   CSN: [id number] Arrival date & time: 12/14/21  0754     History  Chief Complaint  Patient presents with   Dizziness    Norma Soto is a 46 y.o. female with a past medical history of menorrhagia, status post abdominal hysterectomy, obesity.  Presents to the emergency department with a chief complaint of dizziness.  Patient states that she woke this morning at approximately 630 to 7 AM.  Patient states after waking she felt dizziness like "the room was spinning."  Dizziness lasted for few minutes and then resolved spontaneously.  When patient got up to stand and walk to the bathroom dizziness started again.  Patient endorses nausea and vomiting due to her dizziness.  States that emesis was clear.  Patient has had intermittent episodes of dizziness since then with changes in position.  Patient denies any recent falls or injuries.  Denies any numbness, weakness, facial asymmetry, dysarthria, visual disturbance, seizures.  Patient denies any EtOH or illicit drug use.   Dizziness Associated symptoms: no chest pain, no headaches, no hearing loss, no nausea, no shortness of breath, no tinnitus, no vomiting and no weakness       Home Medications Prior to Admission medications   Medication Sig Start Date End Date Taking? Authorizing Provider  albuterol (VENTOLIN HFA) 108 (90 Base) MCG/ACT inhaler Inhale 2 puffs into the lungs every 6 (six) hours as needed for wheezing or shortness of breath. 07/12/19   Melvenia Beam, MD  amoxicillin-clavulanate (AUGMENTIN) 875-125 MG tablet Take 1 tablet by mouth 2 (two) times daily. 10/29/21   Brunetta Jeans, PA-C  fluticasone (FLONASE) 50 MCG/ACT nasal spray Place 2 sprays into both nostrils daily. 10/29/21   Brunetta Jeans, PA-C  ibuprofen (ADVIL) 800 MG tablet Take 1 tablet (800 mg total) by mouth every 8 (eight) hours as needed for fever or headache. 07/30/21   Melvenia Beam, MD  Semaglutide,0.25 or 0.5MG /DOS, (OZEMPIC, 0.25 OR 0.5 MG/DOSE,) 2 MG/1.5ML SOPN Inject 0.5 mg into the skin every 7 (seven) days. 07/30/21   Isaac Bliss, Rayford Halsted, MD  Thiamine HCl (B-1 PO) Take by mouth daily.    [provider]  vitamin B-12 (CYANOCOBALAMIN) 1000 MCG tablet Take 1,000 mcg by mouth daily.    [provider]      Allergies    Citrus    Review of Systems   Review of Systems  Constitutional:  Negative for chills and fever.  HENT:  Negative for ear discharge, ear pain, hearing loss and tinnitus.   Eyes:  Negative for visual disturbance.  Respiratory:  Negative for shortness of breath.   Cardiovascular:  Negative for chest pain.  Gastrointestinal:  Negative for abdominal pain, nausea and vomiting.  Genitourinary:  Negative for difficulty urinating, dysuria, frequency, hematuria and urgency.  Musculoskeletal:  Negative for back pain and neck pain.  Skin:  Negative for color change and rash.  Neurological:  Positive for dizziness. Negative for tremors, seizures, syncope, facial asymmetry, speech difficulty, weakness, light-headedness, numbness and headaches.  Psychiatric/Behavioral:  Negative for confusion.    Physical Exam Updated Vital Signs BP (!) 141/94    Pulse 64    Temp 97.7 F (36.5 C) (Oral)    Resp 18    SpO2 100%  Physical Exam Vitals and nursing note reviewed.  Constitutional:      General: She is not in acute distress.    Appearance: She is not ill-appearing, toxic-appearing or  diaphoretic.  Eyes:     General: No visual field deficit or scleral icterus.       Right eye: No discharge.        Left eye: No discharge.     Extraocular Movements: Extraocular movements intact.     Pupils: Pupils are equal, round, and reactive to light.  Cardiovascular:     Rate and Rhythm: Normal rate.  Pulmonary:     Effort: Pulmonary effort is normal.  Abdominal:     Palpations: Abdomen is soft.     Tenderness: There is no abdominal  tenderness.  Musculoskeletal:     Cervical back: Normal range of motion and neck supple. No rigidity.  Skin:    General: Skin is warm and dry.  Neurological:     General: No focal deficit present.     Mental Status: She is alert and oriented to person, place, and time.     GCS: GCS eye subscore is 4. GCS verbal subscore is 5. GCS motor subscore is 6.     Cranial Nerves: Cranial nerves 2-12 are intact. No cranial nerve deficit, dysarthria or facial asymmetry.     Sensory: Sensation is intact.     Motor: No weakness, tremor, seizure activity or pronator drift.     Coordination: Romberg sign negative. Finger-Nose-Finger Test normal.     Gait: Gait is intact. Gait normal.     Comments: CN II-XII intact, equal grip strength, +5 strength to bilateral upper and lower extremities, sensation to light touch grossly intact to bilateral upper and lower extremities.  Dizziness reproduced with standing  Psychiatric:        Behavior: Behavior is cooperative.    ED Results / Procedures / Treatments   Labs (all labs ordered are listed, but only abnormal results are displayed) Labs Reviewed  BASIC METABOLIC PANEL - Abnormal; Notable for the following components:      Result Value   Glucose, Bld 108 (*)    Calcium 8.6 (*)    Anion gap 3 (*)    All other components within normal limits  CBC WITH DIFFERENTIAL/PLATELET    EKG None  Radiology No results found.  Procedures Procedures    Medications Ordered in ED Medications  ondansetron (ZOFRAN-ODT) disintegrating tablet 4 mg (4 mg Oral Given 12/14/21 0838)  meclizine (ANTIVERT) tablet 25 mg (25 mg Oral Given 12/14/21 0910)    ED Course/ Medical Decision Making/ A&P                           Medical Decision Making  This patient presents to the ED for concern of dizziness, this involves an extensive number of treatment options, and is a complaint that carries with it a high risk of complications and morbidity.  The differential diagnosis  includes but is not limited to vertigo, CVA, TIA.   Co morbidities that complicate the patient evaluation  Obesity   Additional history obtained:  External records from outside source obtained and reviewed including previous provider notes   Lab Tests:  I Ordered, and personally interpreted labs.  The pertinent results include:   CBC and BMP unremarkable   Cardiac Monitoring:  The patient was maintained on a cardiac monitor.  I personally viewed and interpreted the cardiac monitored which showed an underlying rhythm of: Sinus rhythm   Medicines ordered and prescription drug management:  I ordered medication including Zofran for nausea.  Meclizine for dizziness Reevaluation of the patient after these  medicines showed that the patient improved I have reviewed the patients home medicines and have made adjustments as needed   Test Considered:  Noncontrast head CT Was not ordered as patient has no neurological symptoms and improvement in dizziness with meclizine    Problem List / ED Course:  Dizziness Dizziness onset this morning.  Tenderness is reproducible with change in position.  Patient denies any neurological symptoms. Neurological exam is reassuring. Lab work and EKG are unremarkable Patient has improvement in symptoms after receiving meclizine digit improved.  Patient able to stand and ambulate without difficulty. We will plan to have patient follow-up with neurology in outpatient setting Will prescribe patient with meclizine and Zofran Hypertension Patient noted to have elevated blood pressure while in emergency department.  Denies any known history of hypertension We will hold any hypertension medication as patient has no previously documented high blood pressure.  Patient advised to check blood pressure daily and record numbers.  Patient will follow-up closely with primary care provider for further assessment of blood pressure   Reevaluation:  After the  interventions noted above, I reevaluated the patient and found that they have :improved    Dispostion:  After consideration of the diagnostic results and the patients response to treatment, I feel that the patent would benefit from discharge and follow-up with PCP and neurology  Discussed results, findings, treatment and follow up. Patient advised of return precautions. Patient verbalized understanding and agreed with plan.          Final Clinical Impression(s) / ED Diagnoses Final diagnoses:  Dizziness  Hypertension, unspecified type    Rx / DC Orders ED Discharge Orders          Ordered    meclizine (ANTIVERT) 25 MG tablet  3 times daily PRN        12/14/21 0956    ondansetron (ZOFRAN-ODT) 4 MG disintegrating tablet  Every 8 hours PRN        12/14/21 0956              Loni Beckwith, PA-C 12/14/21 0956    Loni Beckwith, PA-C 12/14/21 7124    Isla Pence, MD 12/14/21 1227

## 2021-12-14 NOTE — ED Notes (Signed)
Pt called requesting prescriptions be sent to [facility] since original pharmacy is closed today. Peter PA in process of resending.

## 2021-12-14 NOTE — ED Triage Notes (Signed)
Patient reports waking with the room spinning and one episode of vomiting this morning. States nausea worsens with movement.

## 2021-12-24 ENCOUNTER — Encounter: Payer: Self-pay | Admitting: Internal Medicine

## 2021-12-24 ENCOUNTER — Ambulatory Visit: Payer: BC Managed Care – PPO | Admitting: Internal Medicine

## 2021-12-24 VITALS — BP 126/84 | HR 83 | Resp 20 | Ht 63.0 in | Wt 213.0 lb

## 2021-12-24 DIAGNOSIS — R42 Dizziness and giddiness: Secondary | ICD-10-CM

## 2021-12-24 DIAGNOSIS — E669 Obesity, unspecified: Secondary | ICD-10-CM | POA: Diagnosis not present

## 2021-12-24 NOTE — Progress Notes (Signed)
Established Patient Office Visit     This visit occurred during the SARS-CoV-2 public health emergency.  Safety protocols were in place, including screening questions prior to the visit, additional usage of staff PPE, and extensive cleaning of exam room while observing appropriate contact time as indicated for disinfecting solutions.    CC/Reason for Visit: ED follow-up  HPI: Norma Soto is a 46 y.o. female who is coming in today for the above mentioned reasons.  She was seen in the emergency department on 1/15 for vertigo.  She was given meclizine and Zofran with some relief.  She has continued to have episodes of vertigo and emesis.  Past Medical/Surgical History: Past Medical History:  Diagnosis Date   Abnormal Pap smear of cervix    many yrs ago, just repeat done   ADHD (attention deficit hyperactivity disorder), inattentive type    Dysmenorrhea    Fibroid    Headache    Menorrhagia     Past Surgical History:  Procedure Laterality Date   ABDOMINAL HYSTERECTOMY  2007   fibroid, heavy periods   BREAST BIOPSY Right 10/2018   x2   CESAREAN SECTION      Social History:  reports that she has never smoked. She has never used smokeless tobacco. She reports that she does not drink alcohol and does not use drugs.  Allergies: Allergies  Allergen Reactions   Citrus Hives    Family History:  Family History  Problem Relation Age of Onset   Multiple myeloma Mother    Hypertension Father    Stroke Father    Heart attack Father    Heart disease Father      Current Outpatient Medications:    albuterol (VENTOLIN HFA) 108 (90 Base) MCG/ACT inhaler, Inhale 2 puffs into the lungs every 6 (six) hours as needed for wheezing or shortness of breath., Disp: 18 g, Rfl: 6   fluticasone (FLONASE) 50 MCG/ACT nasal spray, Place 2 sprays into both nostrils daily., Disp: 16 g, Rfl: 0   ibuprofen (ADVIL) 800 MG tablet, Take 1 tablet (800 mg total) by mouth every 8 (eight)  hours as needed for fever or headache., Disp: 45 tablet, Rfl: 3   meclizine (ANTIVERT) 25 MG tablet, Take 1 tablet (25 mg total) by mouth 3 (three) times daily as needed for dizziness. (Patient taking differently: Take 25 mg by mouth 3 (three) times daily as needed for dizziness. Only taken one in the evening), Disp: 30 tablet, Rfl: 0   Thiamine HCl (B-1 PO), Take by mouth daily., Disp: , Rfl:    vitamin B-12 (CYANOCOBALAMIN) 1000 MCG tablet, Take 1,000 mcg by mouth daily., Disp: , Rfl:    amoxicillin-clavulanate (AUGMENTIN) 875-125 MG tablet, Take 1 tablet by mouth 2 (two) times daily. (Patient not taking: Reported on 12/24/2021), Disp: 14 tablet, Rfl: 0   ondansetron (ZOFRAN-ODT) 4 MG disintegrating tablet, Take 1 tablet (4 mg total) by mouth every 8 (eight) hours as needed for nausea or vomiting. (Patient not taking: Reported on 12/24/2021), Disp: 20 tablet, Rfl: 0   Semaglutide,0.25 or 0.5MG/DOS, (OZEMPIC, 0.25 OR 0.5 MG/DOSE,) 2 MG/1.5ML SOPN, Inject 0.5 mg into the skin every 7 (seven) days. (Patient not taking: Reported on 12/24/2021), Disp: 1.5 mL, Rfl: 1  Review of Systems:  Constitutional: Denies fever, chills, diaphoresis, appetite change and fatigue.  HEENT: Denies photophobia, eye pain, redness, hearing loss, ear pain, congestion, sore throat, rhinorrhea, sneezing, mouth sores, trouble swallowing, neck pain, neck stiffness and tinnitus.  Respiratory: Denies SOB, DOE, cough, chest tightness,  and wheezing.   Cardiovascular: Denies chest pain, palpitations and leg swelling.  Gastrointestinal: Denies nausea, vomiting, abdominal pain, diarrhea, constipation, blood in stool and abdominal distention.  Genitourinary: Denies dysuria, urgency, frequency, hematuria, flank pain and difficulty urinating.  Endocrine: Denies: hot or cold intolerance, sweats, changes in hair or nails, polyuria, polydipsia. Musculoskeletal: Denies myalgias, back pain, joint swelling, arthralgias and gait problem.  Skin:  Denies pallor, rash and wound.  Neurological: Denies  seizures, syncope, weakness, light-headedness, numbness and headaches.  Hematological: Denies adenopathy. Easy bruising, personal or family bleeding history  Psychiatric/Behavioral: Denies suicidal ideation, mood changes, confusion, nervousness, sleep disturbance and agitation    Physical Exam: Vitals:   12/24/21 1553  BP: 126/84  Pulse: 83  Resp: 20  SpO2: 99%  Weight: 213 lb (96.6 kg)  Height: 5' 3"  (1.6 m)    Body mass index is 37.73 kg/m.   Constitutional: NAD, calm, comfortable Eyes: PERRL, lids and conjunctivae normal ENMT: Mucous membranes are moist.  Neurologic: Grossly intact and nonfocal Psychiatric: Normal judgment and insight. Alert and oriented x 3. Normal mood.    Impression and Plan:  Vertigo  - Plan: Ambulatory Referral to Neuro Rehab -Likely [facility], instructed on how to perform Epley maneuver, will refer her for vestibular therapy.  Obesity (BMI 30.0-34.9)  - Plan: Amb Ref to Medical Weight Management per request, insurance did not cover Ozempic.  Time spent: 22 minutes reviewing chart, interviewing and examining patient and formulating plan of care.    Lelon Frohlich, MD [facility]

## 2021-12-25 ENCOUNTER — Ambulatory Visit: Payer: BC Managed Care – PPO | Attending: Internal Medicine

## 2021-12-25 ENCOUNTER — Other Ambulatory Visit: Payer: Self-pay

## 2021-12-25 DIAGNOSIS — R42 Dizziness and giddiness: Secondary | ICD-10-CM | POA: Insufficient documentation

## 2021-12-25 DIAGNOSIS — H8113 Benign paroxysmal vertigo, bilateral: Secondary | ICD-10-CM | POA: Insufficient documentation

## 2021-12-25 DIAGNOSIS — R2681 Unsteadiness on feet: Secondary | ICD-10-CM | POA: Diagnosis not present
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
## 2021-12-30 ENCOUNTER — Other Ambulatory Visit: Payer: Self-pay

## 2021-12-30 ENCOUNTER — Ambulatory Visit: Payer: BC Managed Care – PPO | Admitting: Physical Therapy

## 2021-12-30 DIAGNOSIS — R42 Dizziness and giddiness: Secondary | ICD-10-CM | POA: Diagnosis not present

## 2021-12-30 DIAGNOSIS — H8113 Benign paroxysmal vertigo, bilateral: Secondary | ICD-10-CM

## 2021-12-30 DIAGNOSIS — R2681 Unsteadiness on feet: Secondary | ICD-10-CM | POA: Diagnosis not present
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
## 2022-01-05 ENCOUNTER — Ambulatory Visit: Payer: BC Managed Care – PPO | Attending: Internal Medicine

## 2022-01-05 ENCOUNTER — Other Ambulatory Visit: Payer: Self-pay

## 2022-01-05 DIAGNOSIS — R42 Dizziness and giddiness: Secondary | ICD-10-CM | POA: Diagnosis not present

## 2022-01-05 DIAGNOSIS — H8113 Benign paroxysmal vertigo, bilateral: Secondary | ICD-10-CM | POA: Diagnosis not present

## 2022-01-05 DIAGNOSIS — R2681 Unsteadiness on feet: Secondary | ICD-10-CM | POA: Diagnosis not present
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
## 2022-01-09 ENCOUNTER — Ambulatory Visit: Payer: BC Managed Care – PPO

## 2022-01-12 ENCOUNTER — Ambulatory Visit: Payer: BC Managed Care – PPO | Admitting: Physical Therapy

## 2022-01-12 ENCOUNTER — Encounter: Payer: Self-pay | Admitting: Physical Therapy

## 2022-01-12 ENCOUNTER — Other Ambulatory Visit: Payer: Self-pay

## 2022-01-12 DIAGNOSIS — H8113 Benign paroxysmal vertigo, bilateral: Secondary | ICD-10-CM

## 2022-01-12 DIAGNOSIS — R42 Dizziness and giddiness: Secondary | ICD-10-CM | POA: Diagnosis not present

## 2022-01-12 DIAGNOSIS — R2681 Unsteadiness on feet: Secondary | ICD-10-CM | POA: Diagnosis not present

## 2022-01-12 NOTE — Patient Instructions (Addendum)
Gaze Stabilization: Standing Feet Apart    Feet shoulder width apart, keeping eyes on target on wall _5-6___ feet away, tilt head down 15-30 and move head side to side for _60___ seconds. Repeat while moving head up and down for __60__ seconds. Do __2__ sessions per day. Repeat using target on pattern background.   Gaze Stabilization: Tip Card  1.Target must remain in focus, not blurry, and appear stationary while head is in motion. 2.Perform exercises with small head movements (45 to either side of midline). 3.Increase speed of head motion so long as target is in focus. 4.If you wear eyeglasses, be sure you can see target through lens (therapist will give specific instructions for bifocal / progressive lenses). 5.These exercises may provoke dizziness or nausea. Work through these symptoms. If too dizzy, slow head movement slightly. Rest between each exercise. 6.Exercises demand concentration; avoid distractions. 7.For safety, perform standing exercises close to a counter, wall, corner, or next to someone.
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
## 2022-01-14 ENCOUNTER — Encounter: Payer: Self-pay | Admitting: Internal Medicine

## 2022-01-14 ENCOUNTER — Other Ambulatory Visit: Payer: Self-pay | Admitting: Internal Medicine

## 2022-01-14 DIAGNOSIS — E66811 Obesity, class 1: Secondary | ICD-10-CM

## 2022-01-14 DIAGNOSIS — E669 Obesity, unspecified: Secondary | ICD-10-CM

## 2022-01-14 MED ORDER — OZEMPIC (0.25 OR 0.5 MG/DOSE) 2 MG/1.5ML ~~LOC~~ SOPN: 0.5000 mg | PEN_INJECTOR | SUBCUTANEOUS | 0 refills | Status: DC

## 2022-01-15 MED ORDER — OZEMPIC (0.25 OR 0.5 MG/DOSE) 2 MG/1.5ML ~~LOC~~ SOPN: 0.5000 mg | PEN_INJECTOR | SUBCUTANEOUS | 0 refills | Status: DC

## 2022-01-15 NOTE — Addendum Note (Signed)
Addended by: Westley Hummer B on: 01/15/2022 09:51 AM   Modules accepted: Orders

## 2022-01-19 ENCOUNTER — Encounter: Payer: Self-pay | Admitting: Internal Medicine

## 2022-01-20 NOTE — Telephone Encounter (Signed)
PA started for  Ozempic Key: [id number]

## 2022-01-26 ENCOUNTER — Ambulatory Visit: Payer: BC Managed Care – PPO | Admitting: Physical Therapy

## 2022-01-27 NOTE — Telephone Encounter (Signed)
PA was denied

## 2022-02-02 ENCOUNTER — Ambulatory Visit: Payer: BC Managed Care – PPO

## 2022-02-09 ENCOUNTER — Encounter: Payer: BC Managed Care – PPO | Admitting: Physical Therapy

## 2022-03-18 ENCOUNTER — Ambulatory Visit: Payer: BC Managed Care – PPO | Admitting: Internal Medicine

## 2022-03-18 ENCOUNTER — Encounter: Payer: Self-pay | Admitting: Internal Medicine

## 2022-03-18 VITALS — BP 110/80 | HR 73 | Temp 97.8°F | Wt 216.6 lb

## 2022-03-18 DIAGNOSIS — R319 Hematuria, unspecified: Secondary | ICD-10-CM | POA: Diagnosis not present

## 2022-03-18 DIAGNOSIS — M545 Low back pain, unspecified: Secondary | ICD-10-CM

## 2022-03-18 LAB — POCT URINALYSIS DIPSTICK
Bilirubin, UA: NEGATIVE
Blood, UA: POSITIVE
Glucose, UA: NEGATIVE
Ketones, UA: NEGATIVE
Leukocytes, UA: NEGATIVE
Nitrite, UA: NEGATIVE
Protein, UA: NEGATIVE
Spec Grav, UA: 1.025 (ref 1.010–1.025)
Urobilinogen, UA: 0.2 E.U./dL
pH, UA: 5.5 (ref 5.0–8.0)

## 2022-03-18 LAB — URINALYSIS
Bilirubin Urine: NEGATIVE
Ketones, ur: NEGATIVE
Leukocytes,Ua: NEGATIVE
Nitrite: NEGATIVE
Specific Gravity, Urine: 1.02 (ref 1.000–1.030)
Total Protein, Urine: NEGATIVE
Urine Glucose: NEGATIVE
Urobilinogen, UA: 0.2 (ref 0.0–1.0)
pH: 6 (ref 5.0–8.0)

## 2022-03-18 NOTE — Progress Notes (Signed)
? ? ? ?Acute office Visit ? ? ? ? ?This visit occurred during the SARS-CoV-2 public health emergency.  Safety protocols were in place, including screening questions prior to the visit, additional usage of staff PPE, and extensive cleaning of exam room while observing appropriate contact time as indicated for disinfecting solutions.  ? ? ?CC/Reason for Visit: Right flank pain ? ?HPI: Norma Soto is a 46 y.o. female who is coming in today for the above mentioned reasons.  For the past 10 days she has been experiencing right flank pain.  It hurts when pressure is applied directly to that area.  No urinary frequency or dysuria that she has noticed. ? ?Past Medical/Surgical History: ?Past Medical History:  ?Diagnosis Date  ? Abnormal Pap smear of cervix   ? many yrs ago, just repeat done  ? ADHD (attention deficit hyperactivity disorder), inattentive type   ? Dysmenorrhea   ? Fibroid   ? Headache   ? Menorrhagia   ? ? ?Past Surgical History:  ?Procedure Laterality Date  ? ABDOMINAL HYSTERECTOMY  2007  ? fibroid, heavy periods  ? BREAST BIOPSY Right 10/2018  ? x2  ? CESAREAN SECTION    ? ? ?Social History: ? reports that she has never smoked. She has never used smokeless tobacco. She reports that she does not drink alcohol and does not use drugs. ? ?Allergies: ?Allergies  ?Allergen Reactions  ? Citrus Hives  ? ? ?Family History:  ?Family History  ?Problem Relation Age of Onset  ? Multiple myeloma Mother   ? Hypertension Father   ? Stroke Father   ? Heart attack Father   ? Heart disease Father   ? ? ? ?Current Outpatient Medications:  ?  albuterol (VENTOLIN HFA) 108 (90 Base) MCG/ACT inhaler, Inhale 2 puffs into the lungs every 6 (six) hours as needed for wheezing or shortness of breath., Disp: 18 g, Rfl: 6 ?  fluticasone (FLONASE) 50 MCG/ACT nasal spray, Place 2 sprays into both nostrils daily., Disp: 16 g, Rfl: 0 ?  ibuprofen (ADVIL) 800 MG tablet, Take 1 tablet (800 mg total) by mouth every 8 (eight) hours  as needed for fever or headache., Disp: 45 tablet, Rfl: 3 ?  meclizine (ANTIVERT) 25 MG tablet, Take 1 tablet (25 mg total) by mouth 3 (three) times daily as needed for dizziness. (Patient taking differently: Take 25 mg by mouth 3 (three) times daily as needed for dizziness. Only taken one in the evening), Disp: 30 tablet, Rfl: 0 ?  ondansetron (ZOFRAN-ODT) 4 MG disintegrating tablet, Take 1 tablet (4 mg total) by mouth every 8 (eight) hours as needed for nausea or vomiting., Disp: 20 tablet, Rfl: 0 ?  Thiamine HCl (B-1 PO), Take by mouth daily., Disp: , Rfl:  ?  vitamin B-12 (CYANOCOBALAMIN) 1000 MCG tablet, Take 1,000 mcg by mouth daily., Disp: , Rfl:  ? ?Review of Systems:  ?Constitutional: Denies fever, chills, diaphoresis, appetite change and fatigue.  ?HEENT: Denies photophobia, eye pain, redness, hearing loss, ear pain, congestion, sore throat, rhinorrhea, sneezing, mouth sores, trouble swallowing, neck pain, neck stiffness and tinnitus.   ?Respiratory: Denies SOB, DOE, cough, chest tightness,  and wheezing.   ?Cardiovascular: Denies chest pain, palpitations and leg swelling.  ?Gastrointestinal: Denies nausea, vomiting, abdominal pain, diarrhea, constipation, blood in stool and abdominal distention.  ?Genitourinary: Denies dysuria, urgency, frequency, hematuria, flank pain and difficulty urinating.  ?Endocrine: Denies: hot or cold intolerance, sweats, changes in hair or nails, polyuria, polydipsia. ?Musculoskeletal: Denies myalgias, joint  swelling, arthralgias and gait problem.  ?Skin: Denies pallor, rash and wound.  ?Neurological: Denies dizziness, seizures, syncope, weakness, light-headedness, numbness and headaches.  ?Hematological: Denies adenopathy. Easy bruising, personal or family bleeding history  ?Psychiatric/Behavioral: Denies suicidal ideation, mood changes, confusion, nervousness, sleep disturbance and agitation ? ? ? ?Physical Exam: ?Vitals:  ? 03/18/22 0746  ?BP: 110/80  ?Pulse: 73  ?Temp: 97.8  ?F (36.6 ?C)  ?TempSrc: Oral  ?SpO2: 98%  ?Weight: 216 lb 9.6 oz (98.2 kg)  ? ? ?Body mass index is 38.37 kg/m?. ? ? ?Constitutional: NAD, calm, comfortable ?Eyes: PERRL, lids and conjunctivae normal ?ENMT: Mucous membranes are moist.  ?Respiratory: clear to auscultation bilaterally, no wheezing, no crackles. Normal respiratory effort. No accessory muscle use.  ?Cardiovascular: Regular rate and rhythm, no murmurs / rubs / gallops. No extremity edema.   ?Musculoskeletal: She has right CVA tenderness.  ?Neurologic: Grossly intact and nonfocal ?Psychiatric: Normal judgment and insight. Alert and oriented x 3. Normal mood.  ? ? ?Impression and Plan: ? ?Right low back pain, unspecified chronicity, unspecified whether sciatica present - Plan: POCT urinalysis dipstick, Urinalysis, Urine Culture, US Renal ? ?Hematuria, unspecified type - Plan: POCT urinalysis dipstick, Urinalysis, Urine Culture, US Renal ? ?-With flank pain and hematuria on urine dipstick would like to rule out kidney stone, obstruction.  Will send for renal ultrasound. ?-Will withhold antibiotics for now as it is not clear that she has a UTI, will send urine for culture. ?-Due to possibility of this being musculoskeletal in origin, have advised back stretches, icing, as needed NSAIDs. ? ? ? ?Time spent:22 minutes reviewing chart, interviewing and examining patient and formulating plan of care. ? ? ? ?Lelon Frohlich, MD ?[facility] ? ? ?

## 2022-03-19 ENCOUNTER — Encounter: Payer: Self-pay | Admitting: Internal Medicine

## 2022-03-19 LAB — URINE CULTURE
MICRO NUMBER:: 13284432
Result:: NO GROWTH
SPECIMEN QUALITY:: ADEQUATE

## 2022-03-26 ENCOUNTER — Ambulatory Visit
Admission: RE | Admit: 2022-03-26 | Discharge: 2022-03-26 | Disposition: A | Discharge status: Home / Self Care | Payer: BC Managed Care – PPO | Source: Ambulatory Visit | Attending: Internal Medicine | Admitting: Internal Medicine

## 2022-03-26 DIAGNOSIS — N1330 Unspecified hydronephrosis: Secondary | ICD-10-CM | POA: Diagnosis not present

## 2022-03-26 DIAGNOSIS — R319 Hematuria, unspecified: Secondary | ICD-10-CM

## 2022-03-26 DIAGNOSIS — M545 Low back pain, unspecified: Secondary | ICD-10-CM

## 2022-03-30 ENCOUNTER — Other Ambulatory Visit: Payer: Self-pay | Admitting: Internal Medicine

## 2022-03-30 DIAGNOSIS — N1330 Unspecified hydronephrosis: Secondary | ICD-10-CM

## 2022-05-12 ENCOUNTER — Other Ambulatory Visit: Payer: Self-pay | Admitting: Internal Medicine

## 2022-05-12 DIAGNOSIS — Z1231 Encounter for screening mammogram for malignant neoplasm of breast: Secondary | ICD-10-CM

## 2022-05-22 DIAGNOSIS — R1084 Generalized abdominal pain: Secondary | ICD-10-CM | POA: Diagnosis not present

## 2022-05-22 DIAGNOSIS — R351 Nocturia: Secondary | ICD-10-CM | POA: Diagnosis not present

## 2022-05-22 DIAGNOSIS — R350 Frequency of micturition: Secondary | ICD-10-CM | POA: Diagnosis not present

## 2022-05-22 DIAGNOSIS — N130 Hydronephrosis with ureteropelvic junction obstruction: Secondary | ICD-10-CM | POA: Diagnosis not present

## 2022-05-27 ENCOUNTER — Encounter (INDEPENDENT_AMBULATORY_CARE_PROVIDER_SITE_OTHER): Payer: Self-pay

## 2022-05-27 DIAGNOSIS — Z0289 Encounter for other administrative examinations: Secondary | ICD-10-CM

## 2022-05-29 ENCOUNTER — Telehealth: Payer: Self-pay | Admitting: Pulmonary Disease

## 2022-05-29 NOTE — Telephone Encounter (Signed)
7/10 at 10 am will work for me.

## 2022-05-29 NOTE — Telephone Encounter (Signed)
Called and spoke with [place].  July 10,2023 at 1000 has been scheduled for conference call with [place].

## 2022-06-09 DIAGNOSIS — N1330 Unspecified hydronephrosis: Secondary | ICD-10-CM | POA: Diagnosis not present

## 2022-06-09 DIAGNOSIS — N1339 Other hydronephrosis: Secondary | ICD-10-CM | POA: Diagnosis not present

## 2022-06-12 ENCOUNTER — Ambulatory Visit
Admission: RE | Admit: 2022-06-12 | Discharge: 2022-06-12 | Disposition: A | Discharge status: Home / Self Care | Payer: BC Managed Care – PPO | Source: Ambulatory Visit | Attending: Internal Medicine | Admitting: Internal Medicine

## 2022-06-12 DIAGNOSIS — Z1231 Encounter for screening mammogram for malignant neoplasm of breast: Secondary | ICD-10-CM | POA: Diagnosis not present

## 2022-06-19 DIAGNOSIS — R1084 Generalized abdominal pain: Secondary | ICD-10-CM | POA: Diagnosis not present

## 2022-06-19 DIAGNOSIS — N130 Hydronephrosis with ureteropelvic junction obstruction: Secondary | ICD-10-CM | POA: Diagnosis not present

## 2022-06-30 ENCOUNTER — Encounter (INDEPENDENT_AMBULATORY_CARE_PROVIDER_SITE_OTHER): Payer: Self-pay | Admitting: Family Medicine

## 2022-06-30 ENCOUNTER — Ambulatory Visit (INDEPENDENT_AMBULATORY_CARE_PROVIDER_SITE_OTHER): Payer: BC Managed Care – PPO | Admitting: Family Medicine

## 2022-06-30 VITALS — BP 110/76 | HR 66 | Temp 97.8°F | Ht 62.0 in | Wt 217.0 lb

## 2022-06-30 DIAGNOSIS — R0602 Shortness of breath: Secondary | ICD-10-CM

## 2022-06-30 DIAGNOSIS — Z6839 Body mass index (BMI) 39.0-39.9, adult: Secondary | ICD-10-CM

## 2022-06-30 DIAGNOSIS — R739 Hyperglycemia, unspecified: Secondary | ICD-10-CM

## 2022-06-30 DIAGNOSIS — Z1331 Encounter for screening for depression: Secondary | ICD-10-CM

## 2022-06-30 DIAGNOSIS — E7800 Pure hypercholesterolemia, unspecified: Secondary | ICD-10-CM | POA: Diagnosis not present

## 2022-06-30 DIAGNOSIS — R5383 Other fatigue: Secondary | ICD-10-CM

## 2022-06-30 DIAGNOSIS — E538 Deficiency of other specified B group vitamins: Secondary | ICD-10-CM | POA: Diagnosis not present

## 2022-06-30 DIAGNOSIS — E559 Vitamin D deficiency, unspecified: Secondary | ICD-10-CM | POA: Diagnosis not present

## 2022-07-01 LAB — CBC WITH DIFFERENTIAL/PLATELET
Basophils Absolute: 0 10*3/uL (ref 0.0–0.2)
Basos: 1 %
EOS (ABSOLUTE): 0.1 10*3/uL (ref 0.0–0.4)
Eos: 3 %
Hematocrit: 39.2 % (ref 34.0–46.6)
Hemoglobin: 12.8 g/dL (ref 11.1–15.9)
Immature Grans (Abs): 0 10*3/uL (ref 0.0–0.1)
Immature Granulocytes: 0 %
Lymphocytes Absolute: 1.4 10*3/uL (ref 0.7–3.1)
Lymphs: 35 %
MCH: 27.4 pg (ref 26.6–33.0)
MCHC: 32.7 g/dL (ref 31.5–35.7)
MCV: 84 fL (ref 79–97)
Monocytes Absolute: 0.3 10*3/uL (ref 0.1–0.9)
Monocytes: 7 %
Neutrophils Absolute: 2.2 10*3/uL (ref 1.4–7.0)
Neutrophils: 54 %
Platelets: 194 10*3/uL (ref 150–450)
RBC: 4.68 x10E6/uL (ref 3.77–5.28)
RDW: 13.1 % (ref 11.7–15.4)
WBC: 4.1 10*3/uL (ref 3.4–10.8)

## 2022-07-01 LAB — COMPREHENSIVE METABOLIC PANEL
ALT: 17 IU/L (ref 0–32)
AST: 12 IU/L (ref 0–40)
Albumin/Globulin Ratio: 1.1 — ABNORMAL LOW (ref 1.2–2.2)
Albumin: 4 g/dL (ref 3.9–4.9)
Alkaline Phosphatase: 70 IU/L (ref 44–121)
BUN/Creatinine Ratio: 14 (ref 9–23)
BUN: 11 mg/dL (ref 6–24)
Bilirubin Total: 0.3 mg/dL (ref 0.0–1.2)
CO2: 23 mmol/L (ref 20–29)
Calcium: 9.2 mg/dL (ref 8.7–10.2)
Chloride: 103 mmol/L (ref 96–106)
Creatinine, Ser: 0.76 mg/dL (ref 0.57–1.00)
Globulin, Total: 3.6 g/dL (ref 1.5–4.5)
Glucose: 92 mg/dL (ref 70–99)
Potassium: 4.5 mmol/L (ref 3.5–5.2)
Sodium: 139 mmol/L (ref 134–144)
Total Protein: 7.6 g/dL (ref 6.0–8.5)
eGFR: 98 mL/min/{1.73_m2} (ref >59)

## 2022-07-01 LAB — LIPID PANEL WITH LDL/HDL RATIO
Cholesterol, Total: 183 mg/dL (ref 100–199)
HDL: 54 mg/dL (ref >39)
LDL Chol Calc (NIH): 121 mg/dL — ABNORMAL HIGH (ref 0–99)
LDL/HDL Ratio: 2.2 ratio (ref 0.0–3.2)
Triglycerides: 41 mg/dL (ref 0–149)
VLDL Cholesterol Cal: 8 mg/dL (ref 5–40)

## 2022-07-01 LAB — TSH: TSH: 1.63 u[IU]/mL (ref 0.450–4.500)

## 2022-07-01 LAB — HEMOGLOBIN A1C
Est. average glucose Bld gHb Est-mCnc: 114 mg/dL
Hgb A1c MFr Bld: 5.6 % (ref 4.8–5.6)

## 2022-07-01 LAB — VITAMIN D 25 HYDROXY (VIT D DEFICIENCY, FRACTURES): Vit D, 25-Hydroxy: 42.3 ng/mL (ref 30.0–100.0)

## 2022-07-01 LAB — T4, FREE: Free T4: 1.17 ng/dL (ref 0.82–1.77)

## 2022-07-01 LAB — VITAMIN B12: Vitamin B-12: 421 pg/mL (ref 232–1245)

## 2022-07-01 LAB — FOLATE: Folate: 7.2 ng/mL (ref >3.0)

## 2022-07-01 LAB — INSULIN, RANDOM: INSULIN: 14.5 u[IU]/mL (ref 2.6–24.9)

## 2022-07-01 LAB — T3: T3, Total: 126 ng/dL (ref 71–180)

## 2022-07-08 ENCOUNTER — Encounter (INDEPENDENT_AMBULATORY_CARE_PROVIDER_SITE_OTHER): Payer: Self-pay

## 2022-07-13 ENCOUNTER — Emergency Department (HOSPITAL_COMMUNITY)
Admission: EM | Admit: 2022-07-13 | Discharge: 2022-07-14 | Disposition: A | Discharge status: Home / Self Care | Payer: BC Managed Care – PPO | Attending: Emergency Medicine | Admitting: Emergency Medicine

## 2022-07-13 DIAGNOSIS — Z20822 Contact with and (suspected) exposure to covid-19: Secondary | ICD-10-CM | POA: Insufficient documentation

## 2022-07-13 DIAGNOSIS — R519 Headache, unspecified: Secondary | ICD-10-CM | POA: Diagnosis not present

## 2022-07-13 NOTE — Progress Notes (Signed)
Chief Complaint:   OBESITY Norma Soto (MR# [id number]) is a 46 y.o. female who presents for evaluation and treatment of obesity and related comorbidities. Current BMI is Body mass index is 39.69 kg/m. Norma Soto has been struggling with her weight for many years and has been unsuccessful in either losing weight, maintaining weight loss, or reaching her healthy weight goal.  Norma Soto heard about this clinic from a colleague. She is a Scientist, physiological at [employer]. Last time she was 170 lbs was 4 years ago. Lost weight to that weight by decreasing carbs and increasing lean protein. She is eating out 5 times a week at lunch (food provided at work). She skips breakfast 5 times a week. Bottle of water in the am, and may have a cup of coffee with 1tsp of creamer, and 1/4 cup of sugar. At 12-12:30 pm, lunch is [employer]--chicken sandwich or nuggets (8), mac-N-cheese and fruit (satisfied) or [employer], mashed potatoes (1 cup), salmon (3oz) and mixed veggies, (1/2 cup) or salad (1 cup) (satisfied). Dinner is baked chicken, ( 2 wings or 2 drumsticks) with potatoes and onions or rice (1/2 cup). After dinner 1 bag of chips.  Norma Soto is currently in the action stage of change and ready to dedicate time achieving and maintaining a healthier weight. Norma Soto is interested in becoming our patient and working on intensive lifestyle modifications including (but not limited to) diet and exercise for weight loss.  Norma Soto's habits were reviewed today and are as follows: Her family eats meals together, she thinks her family will eat healthier with her, her desired weight loss is 47 lbs, she has been heavy most of her life, she started gaining weight this year, her heaviest weight ever was 225 lbs pounds, she snacks frequently in the evenings, she skips meals frequently, she is frequently drinking liquids with calories, she frequently makes poor food choices, and she frequently eats larger portions than  normal.  Depression Screen [id number] Food and Mood (modified PHQ-9) score was 5.     06/30/2022    7:26 AM  Depression screen PHQ 2/9  Decreased Interest 0  Down, Depressed, Hopeless 1  PHQ - 2 Score 1  Altered sleeping 2  Tired, decreased energy 1  Change in appetite 1  Feeling bad or failure about yourself  0  Trouble concentrating 0  Moving slowly or fidgety/restless 0  Suicidal thoughts 0  PHQ-9 Score 5  Difficult doing work/chores Not difficult at all   Subjective:   1. Other fatigue Yoshie admits to daytime somnolence and admits to waking up still tired. Patient has a history of symptoms of daytime fatigue and morning fatigue. Lashya generally gets 6 hours of sleep per night, and states that she has difficulty falling asleep and nightime awakenings. Snoring is not present. Apneic episodes are not present. Epworth Sleepiness Score is 3.    2. SOBOE (shortness of breath on exertion) Norma Soto notes increasing shortness of breath with exercising and seems to be worsening over time with weight gain. She notes getting out of breath sooner with activity than she used to. This has not gotten worse recently. Norma Soto denies shortness of breath at rest or orthopnea.   3. Vitamin D deficiency Norma Soto is currently taking Vit D 2K IU daily.  4. B12 deficiency Norma Soto in not currently taking any over the counter B12 supplements. She does note fatigue.  5. Hyperglycemia Crystle's blood sugar labs in Jan 2023 were elevated.  6. Pure hypercholesterolemia Norma Soto  isolated LDL in 2021 of 102, HDL of 51, and Trigly of 34.  Assessment/Plan:   1. Other fatigue Norma Soto does feel that her weight is causing her energy to be lower than it should be. Fatigue may be related to obesity, depression or many other causes. Labs will be ordered, and in the meanwhile, Kairie will focus on self care including making healthy food choices, increasing physical activity and focusing on stress reduction.  We will obtain labs today.  - Folate - TSH - T4, free - T3  2. SOBOE (shortness of breath on exertion) Aleksis does feel that she gets out of breath more easily that she used to when she exercises. Ariyon's shortness of breath appears to be obesity related and exercise induced. She has agreed to work on weight loss and gradually increase exercise to treat her exercise induced shortness of breath. Will continue to monitor closely.  - CBC with Differential/Platelet  3. Vitamin D deficiency We will obtain labs today.  - VITAMIN D 25 Hydroxy (Vit-D Deficiency, Fractures)  4. B12 deficiency We will obtain labs today.  - Vitamin B12  5. Hyperglycemia We will obtain labs today.  - Comprehensive metabolic panel - Hemoglobin A1c - Insulin, random  6. Pure hypercholesterolemia We will obtain labs today.  - Lipid Panel With LDL/HDL Ratio  7. Depression screening Norma Soto had a positive depression screening. Depression is commonly associated with obesity and often results in emotional eating behaviors. We will monitor this closely and work on CBT to help improve the non-hunger eating patterns. Referral to Psychology may be required if no improvement is seen as she continues in our clinic.  8. Class 2 severe obesity with serious comorbidity and body mass index (BMI) of 39.0 to 39.9 in adult, unspecified obesity type [facility]) Norma Soto is currently in the action stage of change and her goal is to continue with weight loss efforts. I recommend Norma Soto begin the structured treatment plan as follows:  She has agreed to the Category 2 Plan.  Exercise goals: No exercise has been prescribed at this time.   Behavioral modification strategies: increasing lean protein intake, meal planning and cooking strategies, and keeping healthy foods in the home.  She was informed of the importance of frequent follow-up visits to maximize her success with intensive lifestyle modifications for her multiple  health conditions. She was informed we would discuss her lab results at her next visit unless there is a critical issue that needs to be addressed sooner. Norma Soto agreed to keep her next visit at the agreed upon time to discuss these results.  Objective:   Blood pressure 110/76, pulse 66, temperature 97.8 F (36.6 C), height _0  (1.575 m), weight 217 lb (98.4 kg), SpO2 99 %. Body mass index is 39.69 kg/m.  EKG: Normal sinus rhythm, rate 62, on 12/14/21.  Indirect Calorimeter completed today shows a VO2 of 208 and a REE of  1440.  Her calculated basal metabolic rate is 9381 thus her basal metabolic rate is worse than expected.  General: Cooperative, alert, well developed, in no acute distress. HEENT: Conjunctivae and lids unremarkable. Cardiovascular: Regular rhythm.  Lungs: Normal work of breathing. Neurologic: No focal deficits.   Lab Results  Component Value Date   CREATININE 0.76 06/30/2022   BUN 11 06/30/2022   NA 139 06/30/2022   K 4.5 06/30/2022   CL 103 06/30/2022   CO2 23 06/30/2022   Lab Results  Component Value Date   ALT 17 06/30/2022   AST 12  06/30/2022   ALKPHOS 70 06/30/2022   BILITOT 0.3 06/30/2022   Lab Results  Component Value Date   HGBA1C 5.6 06/30/2022   HGBA1C 5.3 04/19/2020   HGBA1C 5.5 05/18/2019   Lab Results  Component Value Date   INSULIN 14.5 06/30/2022   Lab Results  Component Value Date   TSH 1.630 06/30/2022   Lab Results  Component Value Date   CHOL 183 06/30/2022   HDL 54 06/30/2022   LDLCALC 121 (H) 06/30/2022   TRIG 41 06/30/2022   CHOLHDL 3.0 04/19/2020   Lab Results  Component Value Date   WBC 4.1 06/30/2022   HGB 12.8 06/30/2022   HCT 39.2 06/30/2022   MCV 84 06/30/2022   PLT 194 06/30/2022   No results found for: "IRON", "TIBC", "FERRITIN"  Attestation Statements:   Reviewed by clinician on day of visit: allergies, medications, problem list, medical history, surgical history, family history, social history,  and previous encounter notes.  Time spent on visit including pre-visit chart review and post-visit charting and care was 43 minutes.   I, Elnora Morrison, RMA am acting as transcriptionist for Coralie Common, MD. .This is the patient's first visit at [facility]. The patient's NEW PATIENT PACKET was reviewed at length. Included in the packet: current and past health history, medications, allergies, ROS, gynecologic history (women only), surgical history, family history, social history, weight history, weight loss surgery history (for those that have had weight loss surgery), nutritional evaluation, mood and food questionnaire, PHQ9, Epworth questionnaire, sleep habits questionnaire, patient life and health improvement goals questionnaire. These will all be scanned into the patient's chart under media.   During the visit, I independently reviewed the patient's EKG, bioimpedance scale results, and indirect calorimeter results. I used this information to tailor a meal plan for the patient that will help her to lose weight and will improve her obesity-related conditions going forward. I performed a medically necessary appropriate examination and/or evaluation. I discussed the assessment and treatment plan with the patient. The patient was provided an opportunity to ask questions and all were answered. The patient agreed with the plan and demonstrated an understanding of the instructions. Labs were ordered at this visit and will be reviewed at the next visit unless more critical results need to be addressed immediately. Clinical information was updated and documented in the EMR.   Time spent on visit including pre-visit chart review and post-visit care was 43 minutes.   I have reviewed the above documentation for accuracy and completeness, and I agree with the above. - Coralie Common, MD

## 2022-07-14 ENCOUNTER — Other Ambulatory Visit: Payer: Self-pay

## 2022-07-14 ENCOUNTER — Encounter (HOSPITAL_COMMUNITY): Payer: Self-pay

## 2022-07-14 ENCOUNTER — Ambulatory Visit (INDEPENDENT_AMBULATORY_CARE_PROVIDER_SITE_OTHER): Payer: BC Managed Care – PPO | Admitting: Family Medicine

## 2022-07-14 DIAGNOSIS — Z6838 Body mass index (BMI) 38.0-38.9, adult: Secondary | ICD-10-CM | POA: Diagnosis not present

## 2022-07-14 DIAGNOSIS — R059 Cough, unspecified: Secondary | ICD-10-CM | POA: Diagnosis not present

## 2022-07-14 DIAGNOSIS — U071 COVID-19: Secondary | ICD-10-CM | POA: Diagnosis not present

## 2022-07-14 LAB — SARS CORONAVIRUS 2 BY RT PCR: SARS Coronavirus 2 by RT PCR: NEGATIVE

## 2022-07-14 MED ORDER — SODIUM CHLORIDE 0.9 % IV BOLUS
500.0000 mL | Freq: Once | INTRAVENOUS | Status: AC
Start: 2022-07-14 — End: 2022-07-14
  Administered 2022-07-14: 500 mL via INTRAVENOUS

## 2022-07-14 MED ORDER — KETOROLAC TROMETHAMINE 30 MG/ML IJ SOLN
15.0000 mg | Freq: Once | INTRAMUSCULAR | Status: AC
  Administered 2022-07-14: 15 mg via INTRAVENOUS
  Filled 2022-07-14: qty 1

## 2022-07-14 MED ORDER — METOCLOPRAMIDE HCL 5 MG/ML IJ SOLN
5.0000 mg | Freq: Once | INTRAMUSCULAR | Status: AC
  Administered 2022-07-14: 5 mg via INTRAVENOUS
  Filled 2022-07-14: qty 2

## 2022-07-14 MED ORDER — DIPHENHYDRAMINE HCL 50 MG/ML IJ SOLN
12.5000 mg | Freq: Once | INTRAMUSCULAR | Status: AC
  Administered 2022-07-14: 12.5 mg via INTRAVENOUS
  Filled 2022-07-14: qty 1

## 2022-07-14 NOTE — ED Provider Notes (Signed)
[facility] DEPT Provider Note   CSN: [id number] Arrival date & time: 07/13/22  2351     History  Chief Complaint  Patient presents with   Headache   Cough    Norma Soto is a 46 y.o. female who presents emergency department with chief complaint of global throbbing headache.  Patient states that this has been ongoing for 1 day.  She also has a slight cough.  Patient states that the last time she felt like this she had COVID.  She also had a bad headache once like this when she had an ear infection.States that she does get headaches frequently.  She took a Mucinex without relief.Denies photophobia, phonophobia, UL throbbing, N/V, visual changes, stiff neck, neck pain, rash, or "thunderclap" onset.     Headache Associated symptoms: cough   Cough Associated symptoms: headaches        Home Medications Prior to Admission medications   Medication Sig Start Date End Date Taking? Authorizing Provider  albuterol (VENTOLIN HFA) 108 (90 Base) MCG/ACT inhaler Inhale 2 puffs into the lungs every 6 (six) hours as needed for wheezing or shortness of breath. 07/12/19  Yes Melvenia Beam, MD  dextromethorphan-guaiFENesin [facility] DM) 30-600 MG 12hr tablet Take 1 tablet by mouth 2 (two) times daily.   Yes [provider]  ibuprofen (ADVIL) 800 MG tablet Take 1 tablet (800 mg total) by mouth every 8 (eight) hours as needed for fever or headache. 07/30/21  Yes Melvenia Beam, MD      Allergies    Citrus    Review of Systems   Review of Systems  Respiratory:  Positive for cough.   Neurological:  Positive for headaches.    Physical Exam Updated Vital Signs BP 135/87   Pulse 78   Temp 98 F (36.7 C) (Oral)   Resp 16   Ht '5\' 3"'$  (1.6 m)   Wt 98.4 kg   SpO2 99%   BMI 38.44 kg/m  Physical Exam Physical Exam  Constitutional: Pt is oriented to person, place, and time. Pt appears well-developed and well-nourished. No distress.  HENT:   Head: Normocephalic and atraumatic.  Mouth/Throat: Oropharynx is clear and moist.  Eyes: Conjunctivae and EOM are normal. Pupils are equal, round, and reactive to light. No scleral icterus.  Ears: normal pinna, canal, and tm bl No horizontal, vertical or rotational nystagmus  Neck: Normal range of motion. Neck supple.  Full active and passive ROM without pain No midline or paraspinal tenderness No nuchal rigidity or meningeal signs  Cardiovascular: Normal rate, regular rhythm and intact distal pulses.   Pulmonary/Chest: Effort normal and breath sounds normal. No respiratory distress. Pt has no wheezes. No rales.  Abdominal: Soft. Bowel sounds are normal. There is no tenderness. There is no rebound and no guarding.  Musculoskeletal: Normal range of motion.  Lymphadenopathy:    No cervical adenopathy.  Neurological: Pt. is alert and oriented to person, place, and time. He has normal reflexes. No cranial nerve deficit.  Exhibits normal muscle tone. Coordination normal.  Mental Status:  Alert, oriented, thought content appropriate. Speech fluent without evidence of aphasia. Able to follow 2 step commands without difficulty.  Cranial Nerves:  II:  Peripheral visual fields grossly normal, pupils equal, round, reactive to light III,IV, VI: ptosis not present, extra-ocular motions intact bilaterally  V,VII: smile symmetric, facial light touch sensation equal VIII: hearing grossly normal bilaterally  IX,X: midline uvula rise  XI: bilateral shoulder shrug equal and strong  XII: midline tongue extension  Motor:  5/5 in upper and lower extremities bilaterally including strong and equal grip strength and dorsiflexion/plantar flexion Sensory: Pinprick and light touch normal in all extremities.  Deep Tendon Reflexes: 2+ and symmetric  Cerebellar: normal finger-to-nose with bilateral upper extremities Gait: normal gait and balance CV: distal pulses palpable throughout   Skin: Skin is warm and dry.  No rash noted. Pt is not diaphoretic.  Psychiatric: Pt has a normal mood and affect. Behavior is normal. Judgment and thought content normal.  Nursing note and vitals reviewed.  ED Results / Procedures / Treatments   Labs (all labs ordered are listed, but only abnormal results are displayed) Labs Reviewed  SARS CORONAVIRUS 2 BY RT PCR    EKG None  Radiology No results found.  Procedures Procedures    Medications Ordered in ED Medications  sodium chloride 0.9 % bolus 500 mL (500 mLs Intravenous New Bag/Given 07/14/22 0218)  ketorolac (TORADOL) 30 MG/ML injection 15 mg (15 mg Intravenous Given 07/14/22 0221)  metoCLOPramide (REGLAN) injection 5 mg (5 mg Intravenous Given 07/14/22 0221)  diphenhydrAMINE (BENADRYL) injection 12.5 mg (12.5 mg Intravenous Given 07/14/22 0221)    ED Course/ Medical Decision Making/ A&P                           Medical Decision Making [facility] presents with headache Given the large differential diagnosis for [facility], the decision making in this case is of high complexity.  After evaluating all of the data points in this case, the presentation of [name] is NOT consistent with skull fracture, meningitis/encephalitis, SAH/sentinel bleed, Intracranial Hemorrhage (ICH) (subdural/epidural), acute obstructive hydrocephalus, space occupying lesions, CVA, CO Poisoning, Basilar/vertebral artery dissection, preeclampsia, cerebral venous thrombosis, hypertensive emergency, temporal Arteritis, Idiopathic Intracranial Hypertension (pseudotumor cerebri).  Strict return and follow-up precautions have been given by me personally or by detailed written instructions verbalized by nursing staff using the teach back method to patient/family/caregiver.  Data Reviewed/Counseling: I have reviewed the patient's vital signs, nursing notes, and other relevant tests/information. I had a detailed discussion regarding the historical  points, exam findings, and any diagnostic results supporting the discharge diagnosis. I also discussed the need for outpatient follow-up and the need to return to the ED if symptoms worsen or if there are any questions or concerns that arise at hom    Amount and/or Complexity of Data Reviewed Labs: ordered. Decision-making details documented in ED Course.    Details: negative for COVID/ FLU  Risk Prescription drug management.           Final Clinical Impression(s) / ED Diagnoses Final diagnoses:  Bad headache    Rx / DC Orders ED Discharge Orders     None         Margarita Mail, PA-C 07/14/22 0321    Jeanell Sparrow, DO 07/14/22 9532

## 2022-07-14 NOTE — Discharge Instructions (Signed)

## 2022-07-14 NOTE — ED Triage Notes (Signed)
Pt reports with a headache and cough since yesterday. Pt states that she felt like this last time she had covid.

## 2022-07-17 ENCOUNTER — Ambulatory Visit: Payer: BC Managed Care – PPO | Admitting: Obstetrics & Gynecology

## 2022-07-20 ENCOUNTER — Encounter: Payer: Self-pay | Admitting: Obstetrics & Gynecology

## 2022-07-20 ENCOUNTER — Other Ambulatory Visit (HOSPITAL_COMMUNITY)
Admission: RE | Admit: 2022-07-20 | Discharge: 2022-07-20 | Disposition: A | Discharge status: Home / Self Care | Payer: BC Managed Care – PPO | Source: Ambulatory Visit | Attending: Obstetrics & Gynecology | Admitting: Obstetrics & Gynecology

## 2022-07-20 ENCOUNTER — Ambulatory Visit (INDEPENDENT_AMBULATORY_CARE_PROVIDER_SITE_OTHER): Payer: BC Managed Care – PPO | Admitting: Obstetrics & Gynecology

## 2022-07-20 VITALS — BP 122/80 | Ht 61.0 in | Wt 220.0 lb

## 2022-07-20 DIAGNOSIS — Z1272 Encounter for screening for malignant neoplasm of vagina: Secondary | ICD-10-CM | POA: Insufficient documentation

## 2022-07-20 DIAGNOSIS — Z01419 Encounter for gynecological examination (general) (routine) without abnormal findings: Secondary | ICD-10-CM | POA: Diagnosis not present

## 2022-07-20 DIAGNOSIS — Z90710 Acquired absence of both cervix and uterus: Secondary | ICD-10-CM | POA: Diagnosis not present

## 2022-07-20 DIAGNOSIS — R61 Generalized hyperhidrosis: Secondary | ICD-10-CM

## 2022-07-20 NOTE — Progress Notes (Signed)
Norma Soto 07-Dec-1975 [id number]   History:    46 y.o. G2P2L2 Married    RP:  Established patient presenting for annual gyn exam    HPI: S/P Total Hysterectomy at age 83 for heavy menses/Fibroids.  C/O night sweats.  No pelvic pain.  Pap Neg 03/2020.   Pap reflex/Gono-Chlam today.  Breasts normal.  Mammo 05/2022 Neg. BMI increased to 41.57.  Seeing a Nutritionist.  C/O incomplete emptying of the bladder.  Health labs with PCP.  Recommend Colono.    Past medical history,surgical history, family history and social history were all reviewed and documented in the EPIC chart.  Gynecologic History No LMP recorded. Patient has had a hysterectomy.  Obstetric History OB History  Gravida Para Term Preterm AB Living  '2 2 2     2  '$ SAB IAB Ectopic Multiple Live Births               # Outcome Date GA Lbr Len/2nd Weight Sex Delivery Anes PTL Lv  2 Term           1 Term              ROS: A ROS was performed and pertinent positives and negatives are included in the history. GENERAL: No fevers or chills. HEENT: No change in vision, no earache, sore throat or sinus congestion. NECK: No pain or stiffness. CARDIOVASCULAR: No chest pain or pressure. No palpitations. PULMONARY: No shortness of breath, cough or wheeze. GASTROINTESTINAL: No abdominal pain, nausea, vomiting or diarrhea, melena or bright red blood per rectum. GENITOURINARY: No urinary frequency, urgency, hesitancy or dysuria. MUSCULOSKELETAL: No joint or muscle pain, no back pain, no recent trauma. DERMATOLOGIC: No rash, no itching, no lesions. ENDOCRINE: No polyuria, polydipsia, no heat or cold intolerance. No recent change in weight. HEMATOLOGICAL: No anemia or easy bruising or bleeding. NEUROLOGIC: No headache, seizures, numbness, tingling or weakness. PSYCHIATRIC: No depression, no loss of interest in normal activity or change in sleep pattern.     Exam:   BP 122/80 (BP Location: Right Arm, Patient Position: Sitting, Cuff  Size: Large)   Ht '5\' 1"'$  (1.549 m)   Wt 220 lb (99.8 kg)   BMI 41.57 kg/m   Body mass index is 41.57 kg/m.  General appearance : Well developed well nourished female. No acute distress HEENT: Eyes: no retinal hemorrhage or exudates,  Neck supple, trachea midline, no carotid bruits, no thyroidmegaly Lungs: Clear to auscultation, no rhonchi or wheezes, or rib retractions  Heart: Regular rate and rhythm, no murmurs or gallops Breast:Examined in sitting and supine position were symmetrical in appearance, no palpable masses or tenderness,  no skin retraction, no nipple inversion, no nipple discharge, no skin discoloration, no axillary or supraclavicular lymphadenopathy Abdomen: no palpable masses or tenderness, no rebound or guarding Extremities: no edema or skin discoloration or tenderness  Pelvic: Vulva: Normal             Vagina: No gross lesions or discharge.  Pap reflex/Gono-Chlam done  Cervix/Uterus absent  Adnexa  Without masses or tenderness  Anus: Normal   Assessment/Plan:  46 y.o. female for annual exam   1. Encounter for Papanicolaou smear of vagina as part of routine gynecological examination S/P Total Hysterectomy at age 6 for heavy menses/Fibroids.  C/O night sweats.  No pelvic pain.  Pap Neg 03/2020.   Pap reflex/Gono-Chlam today.  Breasts normal.  Mammo 05/2022 Neg. BMI increased to 41.57.  Seeing a Nutritionist.  C/O incomplete emptying  of the bladder.  Health labs with PCP.  Recommend Colono. - Cytology - PAP( [facility])  2. S/P total hysterectomy  3. Night sweats S/P Total Hysterectomy at age 62 for heavy menses/Fibroids.  C/O night sweats.  No pelvic pain.  Probable menopause.  Cofield drawn today.  HRT with Estradiol patch discussed.  May use Ashwagandha if [facility] is elevated in menopausal range. - F[facility]   Princess Bruins MD, 4:23 PM 07/20/2022

## 2022-07-21 LAB — FOLLICLE STIMULATING HORMONE: FSH: 6.6 m[IU]/mL

## 2022-07-22 ENCOUNTER — Encounter (INDEPENDENT_AMBULATORY_CARE_PROVIDER_SITE_OTHER): Payer: Self-pay | Admitting: Family Medicine

## 2022-07-22 ENCOUNTER — Ambulatory Visit (INDEPENDENT_AMBULATORY_CARE_PROVIDER_SITE_OTHER): Payer: BC Managed Care – PPO | Admitting: Family Medicine

## 2022-07-22 VITALS — BP 129/85 | HR 71 | Temp 98.0°F | Ht 62.0 in | Wt 218.0 lb

## 2022-07-22 DIAGNOSIS — E8881 Metabolic syndrome: Secondary | ICD-10-CM

## 2022-07-22 DIAGNOSIS — Z6839 Body mass index (BMI) 39.0-39.9, adult: Secondary | ICD-10-CM

## 2022-07-22 DIAGNOSIS — E7849 Other hyperlipidemia: Secondary | ICD-10-CM | POA: Diagnosis not present

## 2022-07-22 DIAGNOSIS — E669 Obesity, unspecified: Secondary | ICD-10-CM | POA: Diagnosis not present

## 2022-07-22 DIAGNOSIS — E559 Vitamin D deficiency, unspecified: Secondary | ICD-10-CM

## 2022-07-22 MED ORDER — VITAMIN D3 50 MCG (2000 UT) PO CAPS: 2000.0000 [IU] | ORAL_CAPSULE | Freq: Every day | ORAL | Status: DC

## 2022-07-23 LAB — CYTOLOGY - PAP
Adequacy: ABSENT
Chlamydia: NEGATIVE
Comment: NEGATIVE
Comment: NORMAL
Diagnosis: NEGATIVE
Neisseria Gonorrhea: NEGATIVE

## 2022-07-24 ENCOUNTER — Other Ambulatory Visit: Payer: Self-pay | Admitting: *Deleted

## 2022-07-24 MED ORDER — TINIDAZOLE 500 MG PO TABS: ORAL_TABLET | ORAL | 0 refills | Status: DC

## 2022-08-02 NOTE — Progress Notes (Signed)
Chief Complaint:   OBESITY Norma Soto is here to discuss her progress with her obesity treatment plan along with follow-up of her obesity related diagnoses. Norma Soto is on the Category 2 Plan and states she is following her eating plan approximately 50% of the time. Norma Soto states she is not currently exercising.  Today's visit was #: 2 Starting weight: 217 lbs Starting date: 06/30/2022 Today's weight: 218 lbs Today's date: 07/22/2022 Total lbs lost to date: 0 lbs Total lbs lost since last in-office visit:  0 lbs  Interim History: Norma Soto tested positive for COVID 8 days ago; this time around she did have a headache but otherwise felt mostly normal.  She thinks the meal plan is fine, just needs to get used to the quantity of the meat. Norma Soto is not anticipating any obstacles in the next few weeks.  Subjective:   1. Vitamin D deficiency Reviewed lab results: Vitamin D level of 42.3. Norma Soto is not taking a supplement.  2. Other hyperlipidemia Reviewed lab results: LDL level of 121, HDL - 54, and Triglycerides of 41.  3. Insulin resistance Reviewed lab results: A1c level of 5.6 and Insulin level of 14.5. Norma Soto reports some increase in hunger and an increase for carbohydrates.  Assessment/Plan:   1. Vitamin D deficiency Norma Soto will start OTC Cholecalciferol (Vitamin D2)  2000 IU daily (handout of [facility] was provided).  2. Other hyperlipidemia Norma Soto that labs will be repeated in 3 months.  3. Insulin resistance Discussed with Norma Soto the pathophysiology of Insulin Resistance, Prediabetes, and Diabetes today. She in not currently taking any medications at this time - will follow up at subsequent appointments concerning cravings and carbohydrates intake.  4. Obesity with current BMI of 39.9 Norma Soto is currently in the action stage of change. As such, her goal is to continue with weight loss efforts. She has agreed to the Category 2 Plan.   Exercise  goals: All adults should avoid inactivity. Some physical activity is better than none, and adults who participate in any amount of physical activity gain some health benefits.  Behavioral modification strategies: increasing lean protein intake, meal planning and cooking strategies, and keeping healthy foods in the home.  Norma Soto has agreed to follow-up with our clinic in 2 weeks. She was informed of the importance of frequent follow-up visits to maximize her success with intensive lifestyle modifications for her multiple health conditions.   Objective:   Blood pressure 129/85, pulse 71, temperature 98 F (36.7 C), height '5\' 2"'$  (1.575 m), weight 218 lb (98.9 kg), SpO2 100 %. Body mass index is 39.87 kg/m.  General: Cooperative, alert, well developed, in no acute distress. HEENT: Conjunctivae and lids unremarkable. Cardiovascular: Regular rhythm.  Lungs: Normal work of breathing. Neurologic: No focal deficits.   Lab Results  Component Value Date   CREATININE 0.76 06/30/2022   BUN 11 06/30/2022   NA 139 06/30/2022   K 4.5 06/30/2022   CL 103 06/30/2022   CO2 23 06/30/2022   Lab Results  Component Value Date   ALT 17 06/30/2022   AST 12 06/30/2022   ALKPHOS 70 06/30/2022   BILITOT 0.3 06/30/2022   Lab Results  Component Value Date   HGBA1C 5.6 06/30/2022   HGBA1C 5.3 04/19/2020   HGBA1C 5.5 05/18/2019   Lab Results  Component Value Date   INSULIN 14.5 06/30/2022   Lab Results  Component Value Date   TSH 1.630 06/30/2022   Lab Results  Component Value Date  CHOL 183 06/30/2022   HDL 54 06/30/2022   LDLCALC 121 (H) 06/30/2022   TRIG 41 06/30/2022   CHOLHDL 3.0 04/19/2020   Lab Results  Component Value Date   VD25OH 42.3 06/30/2022   VD25OH 35.3 04/19/2020   VD25OH 48.4 06/24/2018   Lab Results  Component Value Date   WBC 4.1 06/30/2022   HGB 12.8 06/30/2022   HCT 39.2 06/30/2022   MCV 84 06/30/2022   PLT 194 06/30/2022   No results found for: "IRON",  "TIBC", "FERRITIN"  Attestation Statements:   Reviewed by clinician on day of visit: allergies, medications, problem list, medical history, surgical history, family history, social history, and previous encounter notes.  ILennette Bihari, CMA, am acting as transcriptionist for Dr. Coralie Common, MD.   I have reviewed the above documentation for accuracy and completeness, and I agree with the above. - Coralie Common, MD

## 2022-08-17 ENCOUNTER — Encounter (INDEPENDENT_AMBULATORY_CARE_PROVIDER_SITE_OTHER): Payer: Self-pay | Admitting: Family Medicine

## 2022-08-17 ENCOUNTER — Ambulatory Visit (INDEPENDENT_AMBULATORY_CARE_PROVIDER_SITE_OTHER): Payer: BC Managed Care – PPO | Admitting: Family Medicine

## 2022-08-17 VITALS — BP 115/79 | HR 75 | Temp 98.3°F | Ht 62.0 in | Wt 217.0 lb

## 2022-08-17 DIAGNOSIS — E7849 Other hyperlipidemia: Secondary | ICD-10-CM

## 2022-08-17 DIAGNOSIS — E669 Obesity, unspecified: Secondary | ICD-10-CM | POA: Diagnosis not present

## 2022-08-17 DIAGNOSIS — E8881 Metabolic syndrome: Secondary | ICD-10-CM

## 2022-08-17 DIAGNOSIS — Z6839 Body mass index (BMI) 39.0-39.9, adult: Secondary | ICD-10-CM | POA: Diagnosis not present

## 2022-08-18 NOTE — Progress Notes (Signed)
Chief Complaint:   OBESITY Norma Soto is here to discuss her progress with her obesity treatment plan along with follow-up of her obesity related diagnoses. Norma Soto is on the Category 2 Plan and states she is following her eating plan approximately 80% of the time. Norma Soto states she is exercising 0 minutes 0 times per week.  Today's visit was #: 3 Starting weight: 217 lbs Starting date: 06/30/2022 Today's weight: 217 lbs Today's date: 08/17/2022 Total lbs lost to date: 0 lbs Total lbs lost since last in-office visit: 1  Interim History: Taralee has been doing eggs and toast in am. Lunch is sandwich, peaches or grapes. Did have concerns about sodium in microwave meal. Beef stick with cheese for snacks. Supper she is able to get in 6-8 oz of meat.  Subjective:   1. Other hyperlipidemia Norma Soto's last LDL 121, HDL 54, Trigly 41. She is not on medications.  2. Insulin resistance Norma Soto's last A1c 5.6, Insulin 14.5. Minimal carb cravings.  Assessment/Plan:   1. Other hyperlipidemia Will repeat labs in 2-3 months.  2. Insulin resistance Will repeat labs in 2 months.  3. Obesity with current BMI of 39.7 Norma Soto is currently in the action stage of change. As such, her goal is to continue with weight loss efforts. She has agreed to the Category 2 Plan.   Exercise goals: No exercise has been prescribed at this time.  Behavioral modification strategies: increasing lean protein intake, meal planning and cooking strategies, keeping healthy foods in the home, and planning for success.  Norma Soto has agreed to follow-up with our clinic in 3 weeks. She was informed of the importance of frequent follow-up visits to maximize her success with intensive lifestyle modifications for her multiple health conditions.   Objective:   Blood pressure 115/79, pulse 75, temperature 98.3 F (36.8 C), height '5\' 2"'$  (1.575 m), weight 217 lb (98.4 kg), SpO2 99 %. Body mass index is 39.69  kg/m.  General: Cooperative, alert, well developed, in no acute distress. HEENT: Conjunctivae and lids unremarkable. Cardiovascular: Regular rhythm.  Lungs: Normal work of breathing. Neurologic: No focal deficits.   Lab Results  Component Value Date   CREATININE 0.76 06/30/2022   BUN 11 06/30/2022   NA 139 06/30/2022   K 4.5 06/30/2022   CL 103 06/30/2022   CO2 23 06/30/2022   Lab Results  Component Value Date   ALT 17 06/30/2022   AST 12 06/30/2022   ALKPHOS 70 06/30/2022   BILITOT 0.3 06/30/2022   Lab Results  Component Value Date   HGBA1C 5.6 06/30/2022   HGBA1C 5.3 04/19/2020   HGBA1C 5.5 05/18/2019   Lab Results  Component Value Date   INSULIN 14.5 06/30/2022   Lab Results  Component Value Date   TSH 1.630 06/30/2022   Lab Results  Component Value Date   CHOL 183 06/30/2022   HDL 54 06/30/2022   LDLCALC 121 (H) 06/30/2022   TRIG 41 06/30/2022   CHOLHDL 3.0 04/19/2020   Lab Results  Component Value Date   VD25OH 42.3 06/30/2022   VD25OH 35.3 04/19/2020   VD25OH 48.4 06/24/2018   Lab Results  Component Value Date   WBC 4.1 06/30/2022   HGB 12.8 06/30/2022   HCT 39.2 06/30/2022   MCV 84 06/30/2022   PLT 194 06/30/2022   No results found for: "IRON", "TIBC", "FERRITIN"  Attestation Statements:   Reviewed by clinician on day of visit: allergies, medications, problem list, medical history, surgical history, family history, social history,  and previous encounter notes.  I, Elnora Morrison, RMA am acting as transcriptionist for Coralie Common, MD.  I have reviewed the above documentation for accuracy and completeness, and I agree with the above. - Coralie Common, MD

## 2022-09-14 ENCOUNTER — Ambulatory Visit (INDEPENDENT_AMBULATORY_CARE_PROVIDER_SITE_OTHER): Payer: BC Managed Care – PPO | Admitting: Family Medicine

## 2022-09-14 ENCOUNTER — Encounter (INDEPENDENT_AMBULATORY_CARE_PROVIDER_SITE_OTHER): Payer: Self-pay | Admitting: Family Medicine

## 2022-09-14 VITALS — BP 116/80 | HR 77 | Temp 98.0°F | Ht 62.0 in | Wt 215.0 lb

## 2022-09-14 DIAGNOSIS — E669 Obesity, unspecified: Secondary | ICD-10-CM | POA: Diagnosis not present

## 2022-09-14 DIAGNOSIS — E559 Vitamin D deficiency, unspecified: Secondary | ICD-10-CM

## 2022-09-14 DIAGNOSIS — Z6839 Body mass index (BMI) 39.0-39.9, adult: Secondary | ICD-10-CM

## 2022-09-14 DIAGNOSIS — E7849 Other hyperlipidemia: Secondary | ICD-10-CM | POA: Diagnosis not present

## 2022-09-16 DIAGNOSIS — M7751 Other enthesopathy of right foot: Secondary | ICD-10-CM | POA: Diagnosis not present

## 2022-09-16 DIAGNOSIS — S93601A Unspecified sprain of right foot, initial encounter: Secondary | ICD-10-CM | POA: Diagnosis not present

## 2022-09-16 DIAGNOSIS — M2012 Hallux valgus (acquired), left foot: Secondary | ICD-10-CM | POA: Diagnosis not present

## 2022-09-16 DIAGNOSIS — M67472 Ganglion, left ankle and foot: Secondary | ICD-10-CM | POA: Diagnosis not present

## 2022-09-16 DIAGNOSIS — M779 Enthesopathy, unspecified: Secondary | ICD-10-CM | POA: Diagnosis not present

## 2022-09-17 NOTE — Progress Notes (Signed)
Chief Complaint:   OBESITY Norma Soto is here to discuss her progress with her obesity treatment plan along with follow-up of her obesity related diagnoses. Norma Soto is on the Category 2 Plan and states she is following her eating plan approximately 80% of the time. Norma Soto states she is exercising 0 minutes 0 times per week.  Today's visit was #: 4 Starting weight: 217 lbs Starting date: 06/30/2022 Today's weight: 215 lbs Today's date: 09/14/2022 Total lbs lost to date: 2 lbs Total lbs lost since last in-office visit: 2  Interim History: Norma Soto has been following Cat 2 plan and is starting to feel a bit hungry later in day/at night (11pm). Doing a microwaveable meal at supper and whatever is catered for lunch. Nothing planned for next few weeks. No plans for Halloween.  Subjective:   1. Other hyperlipidemia Last LDL 121, HDL 54, Trigly 41. Not on medications.  2. Vitamin D deficiency Norma Soto is not taking Vit D but last level 42.3.  Assessment/Plan:   1. Other hyperlipidemia Repeat labs in Jan/Feb.  2. Vitamin D deficiency Repeat labs in Jan/Feb 2023.  3. Obesity with current BMI of 39.4 Norma Soto is currently in the action stage of change. As such, her goal is to continue with weight loss efforts. She has agreed to the Category 2 Plan and keeping a food journal and adhering to recommended goals of 400-500 calories and 35+ grams of protein supper.  Exercise goals: No exercise has been prescribed at this time.  Behavioral modification strategies: increasing lean protein intake, meal planning and cooking strategies, keeping healthy foods in the home, and planning for success.  Norma Soto has agreed to follow-up with our clinic in 3 weeks. She was informed of the importance of frequent follow-up visits to maximize her success with intensive lifestyle modifications for her multiple health conditions.   Objective:   Blood pressure 116/80, pulse 77, temperature 98 F (36.7 C),  height '5\' 2"'$  (1.575 m), weight 215 lb (97.5 kg), SpO2 93 %. Body mass index is 39.32 kg/m.  General: Cooperative, alert, well developed, in no acute distress. HEENT: Conjunctivae and lids unremarkable. Cardiovascular: Regular rhythm.  Lungs: Normal work of breathing. Neurologic: No focal deficits.   Lab Results  Component Value Date   CREATININE 0.76 06/30/2022   BUN 11 06/30/2022   NA 139 06/30/2022   K 4.5 06/30/2022   CL 103 06/30/2022   CO2 23 06/30/2022   Lab Results  Component Value Date   ALT 17 06/30/2022   AST 12 06/30/2022   ALKPHOS 70 06/30/2022   BILITOT 0.3 06/30/2022   Lab Results  Component Value Date   HGBA1C 5.6 06/30/2022   HGBA1C 5.3 04/19/2020   HGBA1C 5.5 05/18/2019   Lab Results  Component Value Date   INSULIN 14.5 06/30/2022   Lab Results  Component Value Date   TSH 1.630 06/30/2022   Lab Results  Component Value Date   CHOL 183 06/30/2022   HDL 54 06/30/2022   LDLCALC 121 (H) 06/30/2022   TRIG 41 06/30/2022   CHOLHDL 3.0 04/19/2020   Lab Results  Component Value Date   VD25OH 42.3 06/30/2022   VD25OH 35.3 04/19/2020   VD25OH 48.4 06/24/2018   Lab Results  Component Value Date   WBC 4.1 06/30/2022   HGB 12.8 06/30/2022   HCT 39.2 06/30/2022   MCV 84 06/30/2022   PLT 194 06/30/2022   No results found for: "IRON", "TIBC", "FERRITIN"  Attestation Statements:   Reviewed by  clinician on day of visit: allergies, medications, problem list, medical history, surgical history, family history, social history, and previous encounter notes.  I, Elnora Morrison, RMA am acting as transcriptionist for Coralie Common, MD.  I have reviewed the above documentation for accuracy and completeness, and I agree with the above. - Coralie Common, MD

## 2022-10-07 ENCOUNTER — Ambulatory Visit (INDEPENDENT_AMBULATORY_CARE_PROVIDER_SITE_OTHER): Payer: BC Managed Care – PPO | Admitting: Family Medicine

## 2022-10-07 DIAGNOSIS — M779 Enthesopathy, unspecified: Secondary | ICD-10-CM | POA: Diagnosis not present

## 2022-10-14 ENCOUNTER — Encounter (INDEPENDENT_AMBULATORY_CARE_PROVIDER_SITE_OTHER): Payer: Self-pay | Admitting: Family Medicine

## 2022-10-14 ENCOUNTER — Ambulatory Visit (INDEPENDENT_AMBULATORY_CARE_PROVIDER_SITE_OTHER): Payer: BC Managed Care – PPO | Admitting: Family Medicine

## 2022-10-14 VITALS — BP 118/83 | HR 65 | Temp 97.9°F | Ht 62.0 in | Wt 216.0 lb

## 2022-10-14 DIAGNOSIS — E559 Vitamin D deficiency, unspecified: Secondary | ICD-10-CM | POA: Diagnosis not present

## 2022-10-14 DIAGNOSIS — E669 Obesity, unspecified: Secondary | ICD-10-CM | POA: Diagnosis not present

## 2022-10-14 DIAGNOSIS — Z6839 Body mass index (BMI) 39.0-39.9, adult: Secondary | ICD-10-CM

## 2022-10-14 MED ORDER — VITAMIN D3 50 MCG (2000 UT) PO CAPS: 2000.0000 [IU] | ORAL_CAPSULE | Freq: Every day | ORAL | Status: AC

## 2022-10-27 NOTE — Progress Notes (Signed)
Chief Complaint:   OBESITY Norma Soto is here to discuss her progress with her obesity treatment plan along with follow-up of her obesity related diagnoses. Norma Soto is on the Category 2 Plan and keeping a food journal and adhering to recommended goals of 400-500 calories and 35+ grams of protein and states she is following her eating plan approximately 80% of the time. Norma Soto states she is exercising 0 minutes 0 times per week.  Today's visit was #: 5 Starting weight: 217 lbs Starting date: 06/30/2022 Today's weight: 216 lbs Today's date: 10/14/2022 Total lbs lost to date: 1 lb Total lbs lost since last in-office visit: 0  Interim History: Norma Soto has been having more sweet s cravings. Feels much more bloated throughout the day. Looking to change plan completely as she is having cravings and wants more variety. She is hosting Thanksgiving and cooking.  Subjective:   1. Vitamin D deficiency Norma Soto's last Vit D level of 42.3. Denies any nausea, vomiting or muscle weakness. She notes fatigue.  Assessment/Plan:   1. Vitamin D deficiency Continue taking over the counter Vit D.  2. Obesity with current BMI of 39.5 Norma Soto is currently in the action stage of change. As such, her goal is to continue with weight loss efforts. She has agreed to keeping a food journal and adhering to recommended goals of 1150-1300 calories and 80+ grams of protein daily.   Exercise goals: No exercise has been prescribed at this time.  Behavioral modification strategies: increasing lean protein intake, meal planning and cooking strategies, keeping healthy foods in the home, holiday eating strategies , and keeping a strict food journal.  Norma Soto has agreed to follow-up with our clinic in 3 weeks. She was informed of the importance of frequent follow-up visits to maximize her success with intensive lifestyle modifications for her multiple health conditions.   Objective:   Blood pressure 118/83, pulse 65,  temperature 97.9 F (36.6 C), height '5\' 2"'$  (1.575 m), weight 216 lb (98 kg), SpO2 100 %. Body mass index is 39.51 kg/m.  General: Cooperative, alert, well developed, in no acute distress. HEENT: Conjunctivae and lids unremarkable. Cardiovascular: Regular rhythm.  Lungs: Normal work of breathing. Neurologic: No focal deficits.   Lab Results  Component Value Date   CREATININE 0.76 06/30/2022   BUN 11 06/30/2022   NA 139 06/30/2022   K 4.5 06/30/2022   CL 103 06/30/2022   CO2 23 06/30/2022   Lab Results  Component Value Date   ALT 17 06/30/2022   AST 12 06/30/2022   ALKPHOS 70 06/30/2022   BILITOT 0.3 06/30/2022   Lab Results  Component Value Date   HGBA1C 5.6 06/30/2022   HGBA1C 5.3 04/19/2020   HGBA1C 5.5 05/18/2019   Lab Results  Component Value Date   INSULIN 14.5 06/30/2022   Lab Results  Component Value Date   TSH 1.630 06/30/2022   Lab Results  Component Value Date   CHOL 183 06/30/2022   HDL 54 06/30/2022   LDLCALC 121 (H) 06/30/2022   TRIG 41 06/30/2022   CHOLHDL 3.0 04/19/2020   Lab Results  Component Value Date   VD25OH 42.3 06/30/2022   VD25OH 35.3 04/19/2020   VD25OH 48.4 06/24/2018   Lab Results  Component Value Date   WBC 4.1 06/30/2022   HGB 12.8 06/30/2022   HCT 39.2 06/30/2022   MCV 84 06/30/2022   PLT 194 06/30/2022   No results found for: "IRON", "TIBC", "FERRITIN"  Attestation Statements:   Reviewed by  clinician on day of visit: allergies, medications, problem list, medical history, surgical history, family history, social history, and previous encounter notes.  I, Elnora Morrison, RMA am acting as transcriptionist for Coralie Common, MD.  I have reviewed the above documentation for accuracy and completeness, and I agree with the above. - Coralie Common, MD

## 2022-10-28 DIAGNOSIS — M779 Enthesopathy, unspecified: Secondary | ICD-10-CM | POA: Diagnosis not present

## 2022-11-04 ENCOUNTER — Ambulatory Visit (INDEPENDENT_AMBULATORY_CARE_PROVIDER_SITE_OTHER): Payer: BC Managed Care – PPO | Admitting: Family Medicine

## 2022-11-04 ENCOUNTER — Encounter (INDEPENDENT_AMBULATORY_CARE_PROVIDER_SITE_OTHER): Payer: Self-pay

## 2022-11-05 ENCOUNTER — Encounter (INDEPENDENT_AMBULATORY_CARE_PROVIDER_SITE_OTHER): Payer: Self-pay | Admitting: Family Medicine

## 2022-11-05 ENCOUNTER — Ambulatory Visit (INDEPENDENT_AMBULATORY_CARE_PROVIDER_SITE_OTHER): Payer: BC Managed Care – PPO | Admitting: Family Medicine

## 2022-11-05 VITALS — BP 122/82 | HR 71 | Temp 98.6°F | Ht 62.0 in | Wt 217.0 lb

## 2022-11-05 DIAGNOSIS — R638 Other symptoms and signs concerning food and fluid intake: Secondary | ICD-10-CM | POA: Diagnosis not present

## 2022-11-05 DIAGNOSIS — E669 Obesity, unspecified: Secondary | ICD-10-CM | POA: Diagnosis not present

## 2022-11-05 DIAGNOSIS — Z6839 Body mass index (BMI) 39.0-39.9, adult: Secondary | ICD-10-CM

## 2022-11-05 DIAGNOSIS — E88819 Insulin resistance, unspecified: Secondary | ICD-10-CM

## 2022-11-05 MED ORDER — BUPROPION HCL ER (XL) 150 MG PO TB24: 150.0000 mg | ORAL_TABLET | Freq: Every day | ORAL | 0 refills | Status: DC

## 2022-11-05 NOTE — Telephone Encounter (Signed)
Pt has been sched for TB Test on Mon. 11/10/23

## 2022-11-09 ENCOUNTER — Ambulatory Visit: Payer: BC Managed Care – PPO | Admitting: *Deleted

## 2022-11-09 DIAGNOSIS — Z23 Encounter for immunization: Secondary | ICD-10-CM

## 2022-11-09 NOTE — Progress Notes (Signed)
PPD Placement note Norma Soto, 46 y.o. female is here today for placement of PPD test Reason for PPD test: employer Pt taken PPD test before: yes Verified in allergy area and with patient that they are not allergic to the products PPD is made of (Phenol or Tween). No: *** Is patient taking any oral or IV steroid medication now or have they taken it in the last month? no Has the patient ever received the BCG vaccine?: yes Has the patient been in recent contact with anyone known or suspected of having active TB disease?: no      Date of exposure (if applicable): ***      Name of person they were exposed to (if applicable): *** Patient's Country of origin?: *** O: Alert and oriented in NAD. P:  PPD placed on 11/09/2022.  Patient advised to return for reading within 48-72 hours.

## 2022-11-11 ENCOUNTER — Ambulatory Visit (INDEPENDENT_AMBULATORY_CARE_PROVIDER_SITE_OTHER): Payer: BC Managed Care – PPO | Admitting: *Deleted

## 2022-11-11 DIAGNOSIS — Z111 Encounter for screening for respiratory tuberculosis: Secondary | ICD-10-CM

## 2022-11-11 NOTE — Progress Notes (Signed)
PPD Reading Note  PPD read and results entered in [employer].  Result: 0 mm induration.  Interpretation: negative  If test not read within 48-72 hours of initial placement, patient advised to repeat in other arm 1-3 weeks after this test.  Allergic reaction: no

## 2022-11-18 NOTE — Progress Notes (Signed)
Chief Complaint:   OBESITY Norma Soto is here to discuss her progress with her obesity treatment plan along with follow-up of her obesity related diagnoses. Norma Soto is on keeping a food journal and adhering to recommended goals of 1150-1300 calories and 80+ grams of protein and states she is following her eating plan approximately 80-85% of the time. Norma Soto states she is exercising 0 minutes 0 times per week.  Today's visit was #: 6 Starting weight: 217 lbs Starting date: 06/30/2022 Today's weight: 217 lbs Today's date: 11/05/2022 Total lbs lost to date: 0 lbs Total lbs lost since last in-office visit: 0  Interim History: Norma Soto did write down meals but forgot log. She did all cooking for Thanksgiving. She has been craving sweets more frequently. Really struggling with cravings and sweet desires. Sister is hosting Thanksgiving calorie wise she has been 1300-1800 cal/day.  Subjective:   1. Craving for particular food No history of seizure. Blood pressure is well controlled. Significant sweet cravings.  2. Insulin resistance Norma Soto's A1c of 5.6, insulin of 14.5. She is not on medications.  Assessment/Plan:   1. Craving for particular food Start Wellbutrin XL 150 mg daily for 1 mo nth with  refills.  -Start buPROPion (WELLBUTRIN XL) 150 MG 24 hr tablet; Take 1 tablet (150 mg total) by mouth daily.  Dispense: 30 tablet; Refill: 0  2. Insulin resistance Will obtain labs at next appointment.  3. Obesity with current BMI of 39.8 Norma Soto is currently in the action stage of change. As such, her goal is to continue with weight loss efforts. She has agreed to keeping a food journal and adhering to recommended goals of 1150-1300 calories and 80+ grams of protein daily.   Exercise goals: No exercise has been prescribed at this time.  Behavioral modification strategies: increasing lean protein intake, meal planning and cooking strategies, keeping healthy foods in the home, holiday  eating strategies , planning for success, and keeping a strict food journal.  Norma Soto has agreed to follow-up with our clinic in 4 weeks. She was informed of the importance of frequent follow-up visits to maximize her success with intensive lifestyle modifications for her multiple health conditions.   Objective:   Blood pressure 122/82, pulse 71, temperature 98.6 F (37 C), height '5\' 2"'$  (1.575 m), weight 217 lb (98.4 kg), SpO2 99 %. Body mass index is 39.69 kg/m.  General: Cooperative, alert, well developed, in no acute distress. HEENT: Conjunctivae and lids unremarkable. Cardiovascular: Regular rhythm.  Lungs: Normal work of breathing. Neurologic: No focal deficits.   Lab Results  Component Value Date   CREATININE 0.76 06/30/2022   BUN 11 06/30/2022   NA 139 06/30/2022   K 4.5 06/30/2022   CL 103 06/30/2022   CO2 23 06/30/2022   Lab Results  Component Value Date   ALT 17 06/30/2022   AST 12 06/30/2022   ALKPHOS 70 06/30/2022   BILITOT 0.3 06/30/2022   Lab Results  Component Value Date   HGBA1C 5.6 06/30/2022   HGBA1C 5.3 04/19/2020   HGBA1C 5.5 05/18/2019   Lab Results  Component Value Date   INSULIN 14.5 06/30/2022   Lab Results  Component Value Date   TSH 1.630 06/30/2022   Lab Results  Component Value Date   CHOL 183 06/30/2022   HDL 54 06/30/2022   LDLCALC 121 (H) 06/30/2022   TRIG 41 06/30/2022   CHOLHDL 3.0 04/19/2020   Lab Results  Component Value Date   VD25OH 42.3 06/30/2022   VD25OH  35.3 04/19/2020   VD25OH 48.4 06/24/2018   Lab Results  Component Value Date   WBC 4.1 06/30/2022   HGB 12.8 06/30/2022   HCT 39.2 06/30/2022   MCV 84 06/30/2022   PLT 194 06/30/2022   No results found for: "IRON", "TIBC", "FERRITIN"  Attestation Statements:   Reviewed by clinician on day of visit: allergies, medications, problem list, medical history, surgical history, family history, social history, and previous encounter notes.  I, Elnora Morrison, RMA  am acting as transcriptionist for Coralie Common, MD.  I have reviewed the above documentation for accuracy and completeness, and I agree with the above. - Coralie Common, MD

## 2022-12-14 ENCOUNTER — Ambulatory Visit (INDEPENDENT_AMBULATORY_CARE_PROVIDER_SITE_OTHER): Payer: BC Managed Care – PPO | Admitting: Internal Medicine

## 2023-07-23 ENCOUNTER — Ambulatory Visit: Payer: BC Managed Care – PPO | Admitting: Obstetrics & Gynecology

## 2023-07-23 ENCOUNTER — Other Ambulatory Visit: Payer: Self-pay | Admitting: Internal Medicine

## 2023-07-23 DIAGNOSIS — Z1231 Encounter for screening mammogram for malignant neoplasm of breast: Secondary | ICD-10-CM

## 2023-08-18 ENCOUNTER — Ambulatory Visit
Admission: RE | Admit: 2023-08-18 | Discharge: 2023-08-18 | Disposition: A | Discharge status: Home / Self Care | Payer: PRIVATE HEALTH INSURANCE | Source: Ambulatory Visit | Attending: Internal Medicine | Admitting: Internal Medicine

## 2023-08-18 DIAGNOSIS — Z1231 Encounter for screening mammogram for malignant neoplasm of breast: Secondary | ICD-10-CM

## 2023-08-20 ENCOUNTER — Other Ambulatory Visit: Payer: Self-pay | Admitting: Internal Medicine

## 2023-08-20 DIAGNOSIS — R928 Other abnormal and inconclusive findings on diagnostic imaging of breast: Secondary | ICD-10-CM

## 2023-08-25 ENCOUNTER — Other Ambulatory Visit: Payer: PRIVATE HEALTH INSURANCE

## 2023-08-25 ENCOUNTER — Inpatient Hospital Stay
Admission: RE | Admit: 2023-08-25 | Discharge: 2023-08-25 | Discharge status: Home / Self Care | Payer: PRIVATE HEALTH INSURANCE | Source: Ambulatory Visit | Attending: Internal Medicine | Admitting: Internal Medicine

## 2023-08-25 DIAGNOSIS — R928 Other abnormal and inconclusive findings on diagnostic imaging of breast: Secondary | ICD-10-CM

## 2023-09-09 ENCOUNTER — Ambulatory Visit (INDEPENDENT_AMBULATORY_CARE_PROVIDER_SITE_OTHER): Payer: PRIVATE HEALTH INSURANCE | Admitting: Obstetrics and Gynecology

## 2023-09-09 ENCOUNTER — Encounter: Payer: Self-pay | Admitting: Obstetrics and Gynecology

## 2023-09-09 VITALS — BP 110/70 | HR 86 | Ht 62.0 in | Wt 216.0 lb

## 2023-09-09 DIAGNOSIS — Z803 Family history of malignant neoplasm of breast: Secondary | ICD-10-CM | POA: Diagnosis not present

## 2023-09-09 DIAGNOSIS — Z1211 Encounter for screening for malignant neoplasm of colon: Secondary | ICD-10-CM

## 2023-09-09 DIAGNOSIS — Z01419 Encounter for gynecological examination (general) (routine) without abnormal findings: Secondary | ICD-10-CM | POA: Diagnosis not present

## 2023-09-09 NOTE — Assessment & Plan Note (Addendum)
Cervical cancer screening performed according to ASCCP guidelines. Encouraged annual mammogram screening Colonoscopy due, referral sent Labs and immunizations with her primary

## 2023-09-09 NOTE — Assessment & Plan Note (Signed)
Updated fm hx

## 2023-09-09 NOTE — Progress Notes (Signed)
47 y.o. G10P2002 female s/p hysterectomy (age 71 for AUB-F), fm hx of breast cancer  here for annual exam.   No LMP recorded. Patient has had a hysterectomy.   Abnormal bleeding: none Pelvic discharge or pain: none Breast mass, nipple discharge or skin changes : notes occassional nipple tenderness, esp in the shower, no discharge or mass Last PAP:     Component Value Date/Time   DIAGPAP  07/20/2022 1641    - Negative for intraepithelial lesion or malignancy (NILM)   ADEQPAP  07/20/2022 1641    Satisfactory for evaluation; transformation zone component ABSENT.   Last mammogram: 08/25/23 BIRADS 1 Last colonoscopy: never=  GYN HISTORY: S/p hysterectomy (age 77 for AUB-F)   OB History  Gravida Para Term Preterm AB Living  2 2 2     2   SAB IAB Ectopic Multiple Live Births               # Outcome Date GA Lbr Len/2nd Weight Sex Type Anes PTL Lv  2 Term           1 Term             Past Medical History:  Diagnosis Date   Abnormal Pap smear of cervix    many yrs ago, just repeat done   ADHD (attention deficit hyperactivity disorder), inattentive type    Back pain    Bilateral swelling of feet    Constipation    Dysmenorrhea    Fibroid    Headache    Heartburn    Joint pain    Menorrhagia    Palpitations    SOB (shortness of breath)    Vitamin B12 deficiency    Vitamin D deficiency     Past Surgical History:  Procedure Laterality Date   ABDOMINAL HYSTERECTOMY  2007   fibroid, heavy periods   BREAST BIOPSY Right 10/2018   x2   CESAREAN SECTION      Current Outpatient Medications on File Prior to Visit  Medication Sig Dispense Refill   albuterol (VENTOLIN HFA) 108 (90 Base) MCG/ACT inhaler Inhale 2 puffs into the lungs every 6 (six) hours as needed for wheezing or shortness of breath. 18 g 6   Cholecalciferol (VITAMIN D3) 50 MCG (2000 UT) capsule Take 1 capsule (2,000 Units total) by mouth daily.     ibuprofen (ADVIL) 800 MG tablet Take 1 tablet (800 mg total)  by mouth every 8 (eight) hours as needed for fever or headache. 45 tablet 3   No current facility-administered medications on file prior to visit.    Social History   Socioeconomic History   Marital status: Married    Spouse name: Not on file   Number of children: Not on file   Years of education: Not on file   Highest education level: Bachelor's degree (e.g., BA, AB, BS)  Occupational History   Occupation: Astronomer  Tobacco Use   Smoking status: Never   Smokeless tobacco: Never  Vaping Use   Vaping status: Never Used  Substance and Sexual Activity   Alcohol use: No   Drug use: No   Sexual activity: Yes    Partners: Male    Birth control/protection: Surgical    Comment: hysterectomy-1st intercourse 47 yo-Fewer than 5 partners  Other Topics Concern   Not on file  Social History Narrative   Not on file   Social Determinants of Health   Financial Resource Strain: Low Risk  (12/23/2021)  Overall Financial Resource Strain (CARDIA)    Difficulty of Paying Living Expenses: Not hard at all  Food Insecurity: No Food Insecurity (12/23/2021)   Hunger Vital Sign    Worried About Running Out of Food in the Last Year: Never true    Ran Out of Food in the Last Year: Never true  Transportation Needs: No Transportation Needs (12/23/2021)   PRAPARE - Administrator, Civil Service (Medical): No    Lack of Transportation (Non-Medical): No  Physical Activity: Unknown (12/23/2021)   Exercise Vital Sign    Days of Exercise per Week: 0 days    Minutes of Exercise per Session: Not on file  Stress: No Stress Concern Present (12/23/2021)   [employer] of Occupational Health - Occupational Stress Questionnaire    Feeling of Stress : Not at all  Social Connections: Unknown (03/30/2022)   Received from [facility], [facility]   Social Network    Social Network: Not on file  Intimate Partner Violence: Unknown (03/05/2022)   Received from [facility],  [facility]   HITS    Physically Hurt: Not on file    Insult or Talk Down To: Not on file    Threaten Physical Harm: Not on file    Scream or Curse: Not on file    Family History  Problem Relation Age of Onset   Cancer Mother    Multiple myeloma Mother    Hyperlipidemia Father    Hypertension Father    Stroke Father    Heart attack Father    Heart disease Father    Sudden death Father    Breast cancer Maternal Aunt    Breast cancer Maternal Aunt    Breast cancer Other     Allergies  Allergen Reactions   Citrus Hives      PE Today's Vitals   09/09/23 0820  BP: 110/70  Pulse: 86  SpO2: 100%  Weight: 216 lb (98 kg)  Height: 5\' 2"  (1.575 m)   Body mass index is 39.51 kg/m.  Physical Exam Vitals reviewed. Exam conducted with a chaperone present.  Constitutional:      General: She is not in acute distress.    Appearance: Normal appearance.  HENT:     Head: Normocephalic and atraumatic.     Nose: Nose normal.  Eyes:     Extraocular Movements: Extraocular movements intact.     Conjunctiva/sclera: Conjunctivae normal.  Neck:     Thyroid: No thyroid mass, thyromegaly or thyroid tenderness.  Pulmonary:     Effort: Pulmonary effort is normal.  Chest:     Chest wall: No mass or tenderness.  Breasts:    Right: Normal. No swelling, mass, nipple discharge, skin change or tenderness.     Left: Normal. No swelling, mass, nipple discharge, skin change or tenderness.  Abdominal:     General: There is no distension.     Palpations: Abdomen is soft.     Tenderness: There is no abdominal tenderness.  Genitourinary:    General: Normal vulva.     Exam position: Lithotomy position.     Urethra: No prolapse.     Vagina: Normal. No vaginal discharge or bleeding.     Cervix: Normal. No lesion.     Uterus: Normal. Not enlarged and not tender.      Adnexa: Right adnexa normal and left adnexa normal.  Musculoskeletal:        General: Normal range of motion.     Cervical  back: Normal range of motion.  Lymphadenopathy:     Upper Body:     Right upper body: No axillary adenopathy.     Left upper body: No axillary adenopathy.     Lower Body: No right inguinal adenopathy. No left inguinal adenopathy.  Skin:    General: Skin is warm and dry.  Neurological:     General: No focal deficit present.     Mental Status: She is alert.  Psychiatric:        Mood and Affect: Mood normal.        Behavior: Behavior normal.       Assessment and Plan:        Well woman exam with routine gynecological exam Assessment & Plan: Cervical cancer screening performed according to ASCCP guidelines. Encouraged annual mammogram screening Colonoscopy due, referral sent Labs and immunizations with her primary    Colon cancer screening -     Ambulatory referral to Gastroenterology  Family history of breast cancer Assessment & Plan: Updated fm hx  Orders: -     Ambulatory referral to Genetics    Rosalyn Gess, MD

## 2023-09-09 NOTE — Patient Instructions (Signed)
Health Maintenance, Female Adopting a healthy lifestyle and getting preventive care are important in promoting health and wellness. Ask your health care provider about: The right schedule for you to have regular tests and exams. Things you can do on your own to prevent diseases and keep yourself healthy. What should I know about diet, weight, and exercise? Eat a healthy diet  Eat a diet that includes plenty of vegetables, fruits, low-fat dairy products, and lean protein. Do not eat a lot of foods that are high in solid fats, added sugars, or sodium. Maintain a healthy weight Body mass index (BMI) is used to identify weight problems. It estimates body fat based on height and weight. Your health care provider can help determine your BMI and help you achieve or maintain a healthy weight. Get regular exercise Get regular exercise. This is one of the most important things you can do for your health. Most adults should: Exercise for at least 150 minutes each week. The exercise should increase your heart rate and make you sweat (moderate-intensity exercise). Do strengthening exercises at least twice a week. This is in addition to the moderate-intensity exercise. Spend less time sitting. Even light physical activity can be beneficial. Watch cholesterol and blood lipids Have your blood tested for lipids and cholesterol at 47 years of age, then have this test every 5 years. Have your cholesterol levels checked more often if: Your lipid or cholesterol levels are high. You are older than 47 years of age. You are at high risk for heart disease. What should I know about cancer screening? Depending on your health history and family history, you may need to have cancer screening at various ages. This may include screening for: Breast cancer. Cervical cancer. Colorectal cancer. Skin cancer. Lung cancer. What should I know about heart disease, diabetes, and high blood pressure? Blood pressure and heart  disease High blood pressure causes heart disease and increases the risk of stroke. This is more likely to develop in people who have high blood pressure readings or are overweight. Have your blood pressure checked: Every 3-5 years if you are 52-81 years of age. Every year if you are 80 years old or older. Diabetes Have regular diabetes screenings. This checks your fasting blood sugar level. Have the screening done: Once every three years after age 67 if you are at a normal weight and have a low risk for diabetes. More often and at a younger age if you are overweight or have a high risk for diabetes. What should I know about preventing infection? Hepatitis B If you have a higher risk for hepatitis B, you should be screened for this virus. Talk with your health care provider to find out if you are at risk for hepatitis B infection. Hepatitis C Testing is recommended for: Everyone born from [birth year] through 1965. Anyone with known risk factors for hepatitis C. Sexually transmitted infections (STIs) Get screened for STIs, including gonorrhea and chlamydia, if: You are sexually active and are younger than 47 years of age. You are older than 47 years of age and your health care provider tells you that you are at risk for this type of infection. Your sexual activity has changed since you were last screened, and you are at increased risk for chlamydia or gonorrhea. Ask your health care provider if you are at risk. Ask your health care provider about whether you are at high risk for HIV. Your health care provider may recommend a prescription medicine to help prevent HIV  infection. If you choose to take medicine to prevent HIV, you should first get tested for HIV. You should then be tested every 3 months for as long as you are taking the medicine. Osteoporosis and menopause Osteoporosis is a disease in which the bones lose minerals and strength with aging. This can result in bone fractures. If you are 63  years old or older, or if you are at risk for osteoporosis and fractures, ask your health care provider if you should: Be screened for bone loss. Take a calcium or vitamin D supplement to lower your risk of fractures. Be given hormone replacement therapy (HRT) to treat symptoms of menopause. Follow these instructions at home: Alcohol use Do not drink alcohol if: Your health care provider tells you not to drink. You are pregnant, may be pregnant, or are planning to become pregnant. If you drink alcohol: Limit how much you have to: 0-1 drink a day. Know how much alcohol is in your drink. In the [place] one drink equals one 12 oz bottle of beer (355 mL), one 5 oz glass of wine (148 mL), or one 1 oz glass of hard liquor (44 mL). Lifestyle Do not use any products that contain nicotine or tobacco. These products include cigarettes, chewing tobacco, and vaping devices, such as e-cigarettes. If you need help quitting, ask your health care provider. Do not use street drugs. Do not share needles. Ask your health care provider for help if you need support or information about quitting drugs. General instructions Schedule regular health, dental, and eye exams. Stay current with your vaccines. Tell your health care provider if: You often feel depressed. You have ever been abused or do not feel safe at home. Summary Adopting a healthy lifestyle and getting preventive care are important in promoting health and wellness. Follow your health care provider's instructions about healthy diet, exercising, and getting tested or screened for diseases. Follow your health care provider's instructions on monitoring your cholesterol and blood pressure. This information is not intended to replace advice given to you by your health care provider. Make sure you discuss any questions you have with your health care provider. Document Revised: 04/07/2021 Document Reviewed: 04/07/2021 Elsevier Patient Education  2024  [employer].

## 2023-09-11 ENCOUNTER — Telehealth: Payer: Self-pay | Admitting: Genetic Counselor

## 2023-09-11 NOTE — Telephone Encounter (Signed)
Scheduled appointment per inbasket message. Patient is aware of the appointment time and date as well as the address. All questions were answered.

## 2023-10-04 ENCOUNTER — Inpatient Hospital Stay: Payer: PRIVATE HEALTH INSURANCE | Attending: Genetic Counselor | Admitting: Genetic Counselor

## 2023-10-04 ENCOUNTER — Other Ambulatory Visit: Payer: Self-pay | Admitting: Genetic Counselor

## 2023-10-04 ENCOUNTER — Inpatient Hospital Stay: Payer: PRIVATE HEALTH INSURANCE

## 2023-10-04 DIAGNOSIS — Z8041 Family history of malignant neoplasm of ovary: Secondary | ICD-10-CM

## 2023-10-04 DIAGNOSIS — Z1379 Encounter for other screening for genetic and chromosomal anomalies: Secondary | ICD-10-CM

## 2023-10-04 DIAGNOSIS — Z8042 Family history of malignant neoplasm of prostate: Secondary | ICD-10-CM | POA: Diagnosis not present

## 2023-10-04 DIAGNOSIS — Z801 Family history of malignant neoplasm of trachea, bronchus and lung: Secondary | ICD-10-CM | POA: Diagnosis not present

## 2023-10-04 DIAGNOSIS — Z803 Family history of malignant neoplasm of breast: Secondary | ICD-10-CM

## 2023-10-04 DIAGNOSIS — Z8481 Family history of carrier of genetic disease: Secondary | ICD-10-CM

## 2023-10-04 LAB — GENETIC SCREENING ORDER

## 2023-10-05 ENCOUNTER — Encounter: Payer: Self-pay | Admitting: Genetic Counselor

## 2023-10-05 NOTE — Progress Notes (Signed)
REFERRING PROVIDER: Philip Aspen, Limmie Patricia, MD [address] [place],  [place] [address]  PRIMARY PROVIDER:  Philip Aspen, Limmie Patricia, MD  PRIMARY REASON FOR VISIT:  Encounter Diagnoses  Name Primary?   Family history of breast cancer Yes   Family history of gene mutation    Family history of ovarian cancer    Family history of prostate cancer    HISTORY OF PRESENT ILLNESS:   Ms. Archbold, a 47 y.o. female, was seen for a [facility] cancer genetics consultation at the request of Dr. Philip Aspen due to a family history of cancer and a family history of a PALB2 mutation.  Ms. Stankowski presents to clinic today to discuss the possibility of a hereditary predisposition to cancer, to discuss genetic testing, and to further clarify her future cancer risks, as well as potential cancer risks for family members.   Ms. Player is a 47 y.o. female with no personal history of cancer.    RISK FACTORS:  Menarche was at age 86.  First live birth at age 52.  OCP use for approximately 2 years.  Ovaries intact: yes.  Uterus intact: no.  Menopausal status: premenopausal.  HRT use: 0 years. Colonoscopy: reports she has one scheduled 10/2023 Mammogram within the last year: yes. Number of breast biopsies: 1, 2019 biopsy showed benign breast parenchyma Any excessive radiation exposure in the past: no  Past Medical History:  Diagnosis Date   Abnormal Pap smear of cervix    many yrs ago, just repeat done   ADHD (attention deficit hyperactivity disorder), inattentive type    Back pain    Bilateral swelling of feet    Constipation    Dysmenorrhea    Fibroid    Headache    Heartburn    Joint pain    Menorrhagia    Palpitations    SOB (shortness of breath)    Vitamin B12 deficiency    Vitamin D deficiency     Past Surgical History:  Procedure Laterality Date   ABDOMINAL HYSTERECTOMY  2007   fibroid, heavy periods   BREAST BIOPSY Right 10/2018   x2   CESAREAN SECTION       Social History   Socioeconomic History   Marital status: Married    Spouse name: Not on file   Number of children: Not on file   Years of education: Not on file   Highest education level: Bachelor's degree (e.g., BA, AB, BS)  Occupational History   Occupation: Astronomer  Tobacco Use   Smoking status: Never   Smokeless tobacco: Never  Vaping Use   Vaping status: Never Used  Substance and Sexual Activity   Alcohol use: No   Drug use: No   Sexual activity: Yes    Partners: Male    Birth control/protection: Surgical    Comment: hysterectomy-1st intercourse 47 yo-Fewer than 5 partners  Other Topics Concern   Not on file  Social History Narrative   Not on file   Social Determinants of Health   Financial Resource Strain: Low Risk  (12/23/2021)   Overall Financial Resource Strain (CARDIA)    Difficulty of Paying Living Expenses: Not hard at all  Food Insecurity: No Food Insecurity (12/23/2021)   Hunger Vital Sign    Worried About Running Out of Food in the Last Year: Never true    Ran Out of Food in the Last Year: Never true  Transportation Needs: No Transportation Needs (12/23/2021)   PRAPARE - Transportation  Lack of Transportation (Medical): No    Lack of Transportation (Non-Medical): No  Physical Activity: Unknown (12/23/2021)   Exercise Vital Sign    Days of Exercise per Week: 0 days    Minutes of Exercise per Session: Not on file  Stress: No Stress Concern Present (12/23/2021)   [employer] of Occupational Health - Occupational Stress Questionnaire    Feeling of Stress : Not at all  Social Connections: Unknown (03/30/2022)   [facility], [facility]   Social Network    Social Network: Not on file     FAMILY HISTORY:  We obtained a detailed, 4-generation family history.  Significant diagnoses are listed below: Family History  Problem Relation Age of Onset   Multiple myeloma Mother 36   Hyperlipidemia Father     Hypertension Father    Stroke Father    Heart attack Father    Heart disease Father    Sudden death Father    Lung cancer Brother    Breast cancer Maternal Aunt        half-aunt, dx. >50   Ovarian cancer Maternal Aunt        half-aunt, dx. >50   Lung cancer Maternal Aunt        dx. >50   Lung cancer Maternal Uncle        dx. >50   Prostate cancer Maternal Grandfather    Breast cancer Cousin 5 - 17       maternal half-aunt's daughter   Breast cancer Other        PALB2+, maternal second cousin once removed       Ms. Haraway's brother was diagnosed with lung cancer, he died at age 58. Her mother was diagnosed with multiple myeloma at age 62, she died at age 52. One maternal aunt and one maternal uncle were diagnosed with lung cancer at unknown ages, they are both deceased. One maternal half-aunt was diagnosed with breast cancer at an unknown age (>50) and one maternal half-aunt was diagnosed with ovarian cancer at an unknown age (>50), these are half aunt's through her maternal grandfather. Her maternal half-aunt who had ovarian cancer's daughter was diagnosed with breast cancer in her 73s, she died in her 74s due to metastatic breast cancer. Her maternal grandfather was diagnosed with prostate cancer, he died in his 35s. Ms. Threat's maternal second cousin once removed (maternal grandmother's sister's son's daughter's daughter) has a history of breast cancer and was found to have a PALB2 gene mutation on [facility]ae Multi-Cancer Panel (c.3323del). There is no reported Ashkenazi Jewish ancestry.  GENETIC COUNSELING ASSESSMENT: Ms. Humbarger is a 47 y.o. female with a family history of cancer and a PALB2 gene mutation. We, therefore, discussed and recommended the following at today's visit.   DISCUSSION: We discussed that 5 - 10% of cancer is hereditary, with most cases of hereditary breast and ovarian cancer associated with BRCA1/2. There are other genes that can be associated with hereditary  breast and ovarian cancer syndromes.  We discussed that testing is beneficial for several reasons, including knowing about other cancer risks, identifying potential screening and risk-reduction options that may be appropriate, and to understanding if other family members could be at risk for cancer and allowing them to undergo genetic testing.  We reviewed the characteristics, features and inheritance patterns of hereditary cancer syndromes. We spent a large portion of the session reviewing the cancer risks and management recommendations for individuals with a PALB2 gene mutation. We also discussed more  comprehensive genetic testing, the process of testing, insurance coverage and turn-around-time for results. We discussed the implications of a negative, positive, carrier and/or variant of uncertain significant result. We recommended Ms. Reinoso pursue a gene panel.  Ms. Mcinroy was offered a common hereditary cancer panel (34 genes) and an expanded pan-cancer panel (71 genes). Ms. Schuenemann was informed of the benefits and limitations of each panel, including that expanded pan-cancer panels contain several genes that do not have clear management guidelines at this point in time.  We also discussed that as the number of genes included on a panel increases, the chances of variants of uncertain significance increases.  After considering the benefits and limitations of each gene panel, Ms. Sahli elected to have [facility] CancerNext-Expanded Panel.   Based on Ms. Pilz's family history of cancer, she meets medical criteria for genetic testing. Despite that she meets criteria, she may still have an out of pocket cost. We discussed that if her out of pocket cost for testing is over $100, the laboratory should contact them to discuss self-pay prices, patient pay assistance programs, if applicable, and other billing options.  We discussed that some people do not want to undergo genetic testing due to fear of genetic  discrimination.  A federal law called the Genetic Information Non-Discrimination Act (GINA) of 2008 helps protect individuals against genetic discrimination based on their genetic test results.  It impacts both health insurance and employment.  With health insurance, it protects against increased premiums, being kicked off insurance or being forced to take a test in order to be insured.  For employment it protects against hiring, firing and promoting decisions based on genetic test results.  GINA does not apply to those in the [employer], those who work for companies with less than 15 employees, and new life insurance or long-term disability insurance policies.  Health status due to a cancer diagnosis is not protected under GINA.  PLAN: After considering the risks, benefits, and limitations, Ms. Boden provided informed consent to pursue genetic testing and the blood sample was sent to [facility] for analysis of the CancerNext-Expanded Panel. Results should be available within approximately 2-3 weeks' time, at which point they will be disclosed by telephone to Ms. Mings, as will any additional recommendations warranted by these results. Ms. Bar will receive a summary of her genetic counseling visit and a copy of her results once available. This information will also be available in Epic.   Ms. Neiswender's questions were answered to her satisfaction today. Our contact information was provided should additional questions or concerns arise. Thank you for the referral and allowing [place] to share in the care of your patient.   Lalla Brothers, MS, [facility] Genetic Counselor [email or web address] (P) [phone]  The patient was seen for a total of 40 minutes in face-to-face genetic counseling.  The patient was seen alone.  Drs. Pamelia Hoit and/or Mosetta Putt were available to discuss this case as needed.  _______________________________________________________________________ For Office Staff:  Number of  people involved in session: 1 Was an Intern/ student involved with case: yes, Asher Muir

## 2023-10-21 ENCOUNTER — Encounter: Payer: Self-pay | Admitting: Internal Medicine

## 2023-10-21 ENCOUNTER — Ambulatory Visit: Payer: PRIVATE HEALTH INSURANCE | Admitting: *Deleted

## 2023-10-21 VITALS — Ht 62.0 in | Wt 218.0 lb

## 2023-10-21 DIAGNOSIS — Z1211 Encounter for screening for malignant neoplasm of colon: Secondary | ICD-10-CM

## 2023-10-21 MED ORDER — NA SULFATE-K SULFATE-MG SULF 17.5-3.13-1.6 GM/177ML PO SOLN
1.0000 | Freq: Once | ORAL | 0 refills | Status: AC
Start: 2023-10-21 — End: 2023-10-21

## 2023-10-21 NOTE — Progress Notes (Signed)
Pt's name and DOB verified at the beginning of the pre-visit wit 2 identifiers  Pt denies any difficulty with ambulating,sitting, laying down or rolling side to side  Pt has issues with ambulation   Pt has no issues moving head neck or swallowing  No egg or soy allergy known to patient   No issues known to pt with past sedation with any surgeries or procedures  Pt denies having issues being intubated  No FH of Malignant Hyperthermia  Pt is not on diet pills or shots  Pt is not on home 02   Pt is not on blood thinners   Pt denies issues with constipation   Pt has frequent issues with constipation RN instructed pt to use Miralax per bottles instructions a week before prep days. Pt states they will  Pt is not on dialysis  Pt denise any abnormal heart rhythms   Pt denies any upcoming cardiac testing  Pt encouraged to use to use Singlecare or Goodrx to reduce cost   Patient's chart reviewed by Cathlyn Parsons CNRA prior to pre-visit and patient appropriate for the [facility].  Pre-visit completed and red dot placed by patient's name on their procedure day (on provider's schedule).  .  Visit in person   Pt scale weight is 218 lb  Instructed pt why it is important to and  to call if they have any changes in health or new medications. Directed them to the # given and on instructions.     Instructions reviewed. Pt given both [facility] main # and MD on call # prior to instructions.  Pt states understanding. Instructed to review again prior to procedure. Pt states they will.   Pt states she can't do Gatorade,. Reviewed Pedialyte with pt and gave her a list of all names both chemical and others for Citrus so she can avoid that in the no red or purpl;e.drink, Pt understands m  Instructions and coupon given to pt   Instructions sent through My Chart  Coupon sent via text to mobile phone and pt verified they received it

## 2023-10-27 ENCOUNTER — Telehealth: Payer: Self-pay | Admitting: Genetic Counselor

## 2023-10-27 ENCOUNTER — Encounter: Payer: Self-pay | Admitting: Internal Medicine

## 2023-10-27 ENCOUNTER — Encounter: Payer: Self-pay | Admitting: Genetic Counselor

## 2023-10-27 DIAGNOSIS — Z1379 Encounter for other screening for genetic and chromosomal anomalies: Secondary | ICD-10-CM | POA: Insufficient documentation

## 2023-10-27 NOTE — Telephone Encounter (Signed)
I contacted Ms. Younts to discuss her genetic testing results. No pathogenic variants were identified in the 71 genes analyzed. Of note, a variant of uncertain significance was identified in the ATM gene. She did NOT inherit the familial PALB2 gene mutation. Detailed clinic note to follow.  The test report has been scanned into EPIC and is located under the Molecular Pathology section of the Results Review tab.  A portion of the result report is included below for reference.   Lalla Brothers, MS, [facility] Genetic Counselor [email or web address] (P) [phone]

## 2023-10-29 ENCOUNTER — Encounter: Payer: Self-pay | Admitting: Genetic Counselor

## 2023-10-29 ENCOUNTER — Ambulatory Visit: Payer: Self-pay | Admitting: Genetic Counselor

## 2023-10-29 DIAGNOSIS — Z1379 Encounter for other screening for genetic and chromosomal anomalies: Secondary | ICD-10-CM

## 2023-10-29 NOTE — Progress Notes (Signed)
HPI:   Norma Soto was previously seen in the [facility] due to a family history of cancer and a PALB2 gene mutation. Please refer to our prior cancer genetics clinic note for more information regarding our discussion, assessment and recommendations, at the time. Norma Soto's recent genetic test results were disclosed to her, as were recommendations warranted by these results. These results and recommendations are discussed in more detail below.  CANCER HISTORY:  Oncology History   No history exists.    FAMILY HISTORY:  We obtained a detailed, 4-generation family history.  Significant diagnoses are listed below:      Family History  Problem Relation Age of Onset   Multiple myeloma Mother 3   Hyperlipidemia Father     Hypertension Father     Stroke Father     Heart attack Father     Heart disease Father     Sudden death Father     Lung cancer Brother     Breast cancer Maternal Aunt          half-aunt, dx. >50   Ovarian cancer Maternal Aunt          half-aunt, dx. >50   Lung cancer Maternal Aunt          dx. >50   Lung cancer Maternal Uncle          dx. >50   Prostate cancer Maternal Grandfather     Breast cancer Cousin 67 - 43        maternal half-aunt's daughter   Breast cancer Other          PALB2+, maternal second cousin once removed               Norma Soto's brother was diagnosed with lung cancer, he died at age 83. Her mother was diagnosed with multiple myeloma at age 62, she died at age 89. One maternal aunt and one maternal uncle were diagnosed with lung cancer at unknown ages, they are both deceased. One maternal half-aunt was diagnosed with breast cancer at an unknown age (>50) and one maternal half-aunt was diagnosed with ovarian cancer at an unknown age (>50), these are half aunt's through her maternal grandfather. Her maternal half-aunt who had ovarian cancer's daughter was diagnosed with breast cancer in her 25s, she died in her 63s due  to metastatic breast cancer. Her maternal grandfather was diagnosed with prostate cancer, he died in his 25s. Norma Soto's maternal second cousin once removed (maternal grandmother's sister's son's daughter's daughter) has a history of breast cancer and was found to have a PALB2 gene mutation on Invitae Multi-Cancer Panel (c.3323del). There is no reported Ashkenazi Jewish ancestry.  GENETIC TEST RESULTS:  The [facility] CancerNext-Expanded Panel found no pathogenic mutations.   The PALB2 mutation identified in her maternal second cousin once removed was not detected on her test. Therefore, she did NOT inherit the familial PALB2 gene mutation.  The CancerNext-Expanded gene panel offered by [facility] and includes sequencing, rearrangement, and RNA analysis for the following 71 genes: AIP, ALK, APC, ATM, AXIN2, BAP1, BARD1, BMPR1A, BRCA1, BRCA2, BRIP1, CDC73, CDH1, CDK4, CDKN1B, CDKN2A, CHEK2, CTNNA1, DICER1, FH, FLCN, KIF1B, LZTR1, MAX, MEN1, MET, MLH1, MSH2, MSH3, MSH6, MUTYH, NF1, NF2, NTHL1, PALB2, PHOX2B, PMS2, POT1, PRKAR1A, PTCH1, PTEN, RAD51C, RAD51D, RB1, RET, SDHA, SDHAF2, SDHB, SDHC, SDHD, SMAD4, SMARCA4, SMARCB1, SMARCE1, STK11, SUFU, TMEM127, TP53, TSC1, TSC2, and VHL (sequencing and deletion/duplication); EGFR, EGLN1, HOXB13, KIT, MITF, PDGFRA, POLD1, and POLE (sequencing  only); EPCAM and GREM1 (deletion/duplication only).    The test report has been scanned into EPIC and is located under the Molecular Pathology section of the Results Review tab.  A portion of the result report is included below for reference. Genetic testing reported out on 10/27/2023.       Genetic testing identified a variant of uncertain significance (VUS) in the ATM gene called p.R2060P.  At this time, it is unknown if this variant is associated with an increased risk for cancer or if it is benign, but most uncertain variants are reclassified to benign. It should not be used to make medical management decisions. With  time, we suspect the laboratory will determine the significance of this variant, if any. If the laboratory reclassifies this variant, we will attempt to contact Norma Soto to discuss it further.   The PALB2 gene mutation is coming from her maternal grandmother's side of the family Therefore, we still do not know the cause of the cancer on her maternal grandfather's side of the family. Even though a pathogenic variant was not identified that explain that cancer, possible explanations may include: There may be no hereditary risk for cancer on her maternal grandfather's side of the family. The cancers may be due to other genetic or environmental factors. There may be a gene mutation in one of these genes that current testing methods cannot detect, but that chance is small. There could be another gene that has not yet been discovered, or that we have not yet tested, that is responsible for the cancer diagnoses in the family.  It is also possible there is a hereditary cause for the cancer in the family that Norma Soto did not inherit. The variant of uncertain significance detected in the ATM gene may be reclassified as a pathogenic variant in the future. At this time, we do not know if this variant increases the risk for cancer.  Therefore, it is important to remain in touch with cancer genetics in the future so that we can continue to offer Norma Soto the most up to date genetic testing.   ADDITIONAL GENETIC TESTING:  We discussed with Norma Soto that her genetic testing was fairly extensive.  If there are genes identified to increase cancer risk that can be analyzed in the future, we would be happy to discuss and coordinate this testing at that time.     CANCER SCREENING RECOMMENDATIONS:  Norma Soto test result is considered negative (normal). An individual's cancer risk and medical management are not determined by genetic test results alone. Overall cancer risk assessment incorporates additional  factors, including personal medical history, family history, and any available genetic information that may result in a personalized plan for cancer prevention and surveillance. Therefore, it is recommended she continue to follow the cancer management and screening guidelines provided by her primary healthcare provider.  RECOMMENDATIONS FOR FAMILY MEMBERS:   Since she did not inherit a mutation in a cancer predisposition gene included on this panel, her children could not have inherited a mutation from her in one of these genes. Other members of the family may still carry a pathogenic variant in one of these genes that Norma Soto did not inherit. Based on the family history of a PALB2 gene mutation and cancer, we recommend her maternal family members have genetic counseling and testing.  We do not recommend familial testing for the ATM variant of uncertain significance (VUS).  FOLLOW-UP:  Cancer genetics is a rapidly advancing field and it is  possible that new genetic tests will be appropriate for her and/or her family members in the future. We encouraged her to remain in contact with cancer genetics on an annual basis so we can update her personal and family histories and let her know of advances in cancer genetics that may benefit this family.   Our contact number was provided. Norma Soto's questions were answered to her satisfaction, and she knows she is welcome to call us at anytime with additional questions or concerns.   Lalla Brothers, MS, [facility] Genetic Counselor [email or web address] (P) [phone]

## 2023-11-04 ENCOUNTER — Encounter: Payer: Self-pay | Admitting: Internal Medicine

## 2023-11-04 ENCOUNTER — Ambulatory Visit: Payer: PRIVATE HEALTH INSURANCE | Admitting: Internal Medicine

## 2023-11-04 VITALS — BP 97/46 | HR 60 | Temp 97.9°F | Resp 16 | Ht 62.0 in | Wt 218.0 lb

## 2023-11-04 DIAGNOSIS — Z1211 Encounter for screening for malignant neoplasm of colon: Secondary | ICD-10-CM

## 2023-11-04 DIAGNOSIS — K6389 Other specified diseases of intestine: Secondary | ICD-10-CM

## 2023-11-04 DIAGNOSIS — K648 Other hemorrhoids: Secondary | ICD-10-CM

## 2023-11-04 DIAGNOSIS — D122 Benign neoplasm of ascending colon: Secondary | ICD-10-CM

## 2023-11-04 HISTORY — PX: COLONOSCOPY: SHX174

## 2023-11-04 MED ORDER — SODIUM CHLORIDE 0.9 % IV SOLN: 500.0000 mL | Freq: Once | INTRAVENOUS | Status: DC

## 2023-11-04 NOTE — Patient Instructions (Signed)

## 2023-11-04 NOTE — Progress Notes (Signed)
Called to room to assist during endoscopic procedure.  Patient ID and intended procedure confirmed with present staff. Received instructions for my participation in the procedure from the performing physician.  

## 2023-11-04 NOTE — Progress Notes (Signed)
Pt's states no medical or surgical changes since previsit or office visit. 

## 2023-11-04 NOTE — Progress Notes (Signed)
Vss nad trans to pacu 

## 2023-11-04 NOTE — Op Note (Signed)
[facility] Patient Name: Norma Soto Procedure Date: 11/04/2023 8:46 AM MRN: [id number] Endoscopist: Particia Lather , , [id number] Age: 47 Referring MD:  Date of Birth: 14-Jun-1976 Gender: Female Account #: [id number] Procedure:                Colonoscopy Indications:              Screening for colorectal malignant neoplasm, This                            is the patient's first colonoscopy Medicines:                Monitored Anesthesia Care Procedure:                Pre-Anesthesia Assessment:                           - Prior to the procedure, a History and Physical                            was performed, and patient medications and                            allergies were reviewed. The patient's tolerance of                            previous anesthesia was also reviewed. The risks                            and benefits of the procedure and the sedation                            options and risks were discussed with the patient.                            All questions were answered, and informed consent                            was obtained. Prior Anticoagulants: The patient has                            taken no anticoagulant or antiplatelet agents. ASA                            Grade Assessment: II - A patient with mild systemic                            disease. After reviewing the risks and benefits,                            the patient was deemed in satisfactory condition to                            undergo the procedure.  After obtaining informed consent, the colonoscope                            was passed under direct vision. Throughout the                            procedure, the patient's blood pressure, pulse, and                            oxygen saturations were monitored continuously. The                            CF [id number] #[id number] was introduced through the anus                            and advanced to  the the cecum, identified by the                            appendiceal orifice, ileocecal valve and palpation.                            The colonoscopy was performed without difficulty.                            The patient tolerated the procedure well. The                            quality of the bowel preparation was good. The                            terminal ileum, ileocecal valve, appendiceal                            orifice, and rectum were photographed. Scope In: 8:50:31 AM Scope Out: 9:00:54 AM Scope Withdrawal Time: 0 hours 8 minutes 18 seconds  Total Procedure Duration: 0 hours 10 minutes 23 seconds  Findings:                 The terminal ileum appeared normal.                           A 3 mm polyp was found in the ascending colon. The                            polyp was sessile. The polyp was removed with a                            cold snare. Resection and retrieval were complete.                           Non-bleeding internal hemorrhoids were found during                            retroflexion. Complications:  No immediate complications. Estimated Blood Loss:     Estimated blood loss was minimal. Impression:               - The examined portion of the ileum was normal.                           - One 3 mm polyp in the ascending colon, removed                            with a cold snare. Resected and retrieved.                           - Non-bleeding internal hemorrhoids. Recommendation:           - Discharge patient to home (with escort).                           - Await pathology results.                           - The findings and recommendations were discussed                            with the patient. Dr Particia Lather "Alan Ripper" Leonides Schanz,  11/04/2023 9:18:50 AM

## 2023-11-04 NOTE — Progress Notes (Signed)
GASTROENTEROLOGY PROCEDURE H&P NOTE   Primary Care Physician: Philip Aspen, Limmie Patricia, MD    Reason for Procedure:   Colon cancer screening  Plan:    Colonoscopy  Patient is appropriate for endoscopic procedure(s) in the ambulatory ([facility]) setting.  The nature of the procedure, as well as the risks, benefits, and alternatives were carefully and thoroughly reviewed with the patient. Ample time for discussion and questions allowed. The patient understood, was satisfied, and agreed to proceed.     HPI: Norma Soto is a 47 y.o. female who presents for colonoscopy for colon cancer screening. Denies blood in stools, changes in bowel habits, or unintentional weight loss. Denies family history of colon cancer.  Past Medical History:  Diagnosis Date   Abnormal Pap smear of cervix    many yrs ago, just repeat done   ADHD (attention deficit hyperactivity disorder), inattentive type    Asthma    Back pain    Bilateral swelling of feet    Constipation    Dysmenorrhea    Fibroid    Headache    Heartburn    Joint pain    Menorrhagia    Palpitations    Sickle cell trait (HCC)    SOB (shortness of breath)    Vitamin B12 deficiency    Vitamin D deficiency     Past Surgical History:  Procedure Laterality Date   ABDOMINAL HYSTERECTOMY  2007   fibroid, heavy periods   BREAST BIOPSY Right 10/2018   x2   CESAREAN SECTION      Prior to Admission medications   Medication Sig Start Date End Date Taking? Authorizing Provider  Cholecalciferol (VITAMIN D3) 50 MCG (2000 UT) capsule Take 1 capsule (2,000 Units total) by mouth daily. 10/14/22  Yes Langston Reusing, MD  albuterol (VENTOLIN HFA) 108 (90 Base) MCG/ACT inhaler Inhale 2 puffs into the lungs every 6 (six) hours as needed for wheezing or shortness of breath. 07/12/19   Anson Fret, MD  ibuprofen (ADVIL) 800 MG tablet Take 1 tablet (800 mg total) by mouth every 8 (eight) hours as needed for fever or  headache. Patient not taking: Reported on 10/21/2023 07/30/21   Anson Fret, MD    Current Outpatient Medications  Medication Sig Dispense Refill   Cholecalciferol (VITAMIN D3) 50 MCG (2000 UT) capsule Take 1 capsule (2,000 Units total) by mouth daily.     albuterol (VENTOLIN HFA) 108 (90 Base) MCG/ACT inhaler Inhale 2 puffs into the lungs every 6 (six) hours as needed for wheezing or shortness of breath. 18 g 6   ibuprofen (ADVIL) 800 MG tablet Take 1 tablet (800 mg total) by mouth every 8 (eight) hours as needed for fever or headache. (Patient not taking: Reported on 10/21/2023) 45 tablet 3   Current Facility-Administered Medications  Medication Dose Route Frequency Provider Last Rate Last Admin   0.9 %  sodium chloride infusion  500 mL Intravenous Once Imogene Burn, MD        Allergies as of 11/04/2023 - Review Complete 11/04/2023  Allergen Reaction Noted   Citrus Hives 07/11/2021    Family History  Problem Relation Age of Onset   Multiple myeloma Mother 41   Hyperlipidemia Father    Hypertension Father    Stroke Father    Heart attack Father    Heart disease Father    Sudden death Father    Lung cancer Brother    Breast cancer Maternal Aunt  half-aunt, dx. >50   Ovarian cancer Maternal Aunt        half-aunt, dx. >50   Lung cancer Maternal Aunt        dx. >50   Lung cancer Maternal Uncle        dx. >50   Prostate cancer Maternal Grandfather    Breast cancer Cousin 26 - 46       maternal half-aunt's daughter   Breast cancer Other        PALB2+, maternal second cousin once removed   Colon cancer Neg Hx    Colon polyps Neg Hx    Esophageal cancer Neg Hx    Stomach cancer Neg Hx    Rectal cancer Neg Hx     Social History   Socioeconomic History   Marital status: Married    Spouse name: Not on file   Number of children: Not on file   Years of education: Not on file   Highest education level: Bachelor's degree (e.g., BA, AB, BS)  Occupational History    Occupation: Astronomer  Tobacco Use   Smoking status: Never   Smokeless tobacco: Never  Vaping Use   Vaping status: Never Used  Substance and Sexual Activity   Alcohol use: No   Drug use: No   Sexual activity: Yes    Partners: Male    Birth control/protection: Surgical    Comment: hysterectomy-1st intercourse 47 yo-Fewer than 5 partners  Other Topics Concern   Not on file  Social History Narrative   Not on file   Social Determinants of Health   Financial Resource Strain: Low Risk  (12/23/2021)   Overall Financial Resource Strain (CARDIA)    Difficulty of Paying Living Expenses: Not hard at all  Food Insecurity: No Food Insecurity (12/23/2021)   Hunger Vital Sign    Worried About Running Out of Food in the Last Year: Never true    Ran Out of Food in the Last Year: Never true  Transportation Needs: No Transportation Needs (12/23/2021)   PRAPARE - Administrator, Civil Service (Medical): No    Lack of Transportation (Non-Medical): No  Physical Activity: Unknown (12/23/2021)   Exercise Vital Sign    Days of Exercise per Week: 0 days    Minutes of Exercise per Session: Not on file  Stress: No Stress Concern Present (12/23/2021)   [employer] of Occupational Health - Occupational Stress Questionnaire    Feeling of Stress : Not at all  Social Connections: Unknown (03/30/2022)   Received from [facility]   Social Network    Social Network: Not on file  Intimate Partner Violence: Unknown (03/05/2022)   Received from [facility]   HITS    Physically Hurt: Not on file    Insult or Talk Down To: Not on file    Threaten Physical Harm: Not on file    Scream or Curse: Not on file    Physical Exam: Vital signs in last 24 hours: BP 129/68   Pulse 77   Temp 97.9 F (36.6 C) (Temporal)   Ht 5\' 2"  (1.575 m)   Wt 218 lb (98.9 kg)   SpO2 100%   BMI 39.87 kg/m  GEN: NAD EYE: Sclerae anicteric ENT: MMM CV:  Non-tachycardic Pulm: No increased work of breathing GI: Soft, NT/ND NEURO:  Alert & Oriented   Eulah Pont, MD [facility]  11/04/2023 8:38 AM

## 2023-11-05 ENCOUNTER — Telehealth: Payer: Self-pay

## 2023-11-05 NOTE — Telephone Encounter (Signed)
  Follow up Call-     11/04/2023    7:40 AM  Call back number  Post procedure Call Back phone  # [phone]  Permission to leave phone message Yes     Patient questions:  Do you have a fever, pain , or abdominal swelling? No. Pain Score  0 *  Have you tolerated food without any problems? Yes.    Have you been able to return to your normal activities? Yes.    Do you have any questions about your discharge instructions: Diet   No. Medications  No. Follow up visit  No.  Do you have questions or concerns about your Care? No.  Actions: * If pain score is 4 or above: No action needed, pain <4.

## 2023-11-10 ENCOUNTER — Encounter: Payer: Self-pay | Admitting: Internal Medicine

## 2023-11-10 LAB — SURGICAL PATHOLOGY

## 2023-11-24 ENCOUNTER — Encounter: Payer: Self-pay | Admitting: Internal Medicine

## 2024-01-20 ENCOUNTER — Ambulatory Visit: Payer: PRIVATE HEALTH INSURANCE | Admitting: Internal Medicine

## 2024-07-27 ENCOUNTER — Other Ambulatory Visit: Payer: Self-pay | Admitting: Family Medicine

## 2024-07-27 DIAGNOSIS — Z1231 Encounter for screening mammogram for malignant neoplasm of breast: Secondary | ICD-10-CM

## 2024-08-25 ENCOUNTER — Ambulatory Visit
Admission: RE | Admit: 2024-08-25 | Discharge: 2024-08-25 | Disposition: A | Discharge status: Home / Self Care | Payer: PRIVATE HEALTH INSURANCE | Source: Ambulatory Visit | Attending: Family Medicine | Admitting: Family Medicine

## 2024-08-25 DIAGNOSIS — Z1231 Encounter for screening mammogram for malignant neoplasm of breast: Secondary | ICD-10-CM

## 2024-09-11 ENCOUNTER — Ambulatory Visit (INDEPENDENT_AMBULATORY_CARE_PROVIDER_SITE_OTHER): Payer: PRIVATE HEALTH INSURANCE | Admitting: Obstetrics and Gynecology

## 2024-09-11 ENCOUNTER — Encounter: Payer: Self-pay | Admitting: Obstetrics and Gynecology

## 2024-09-11 VITALS — BP 112/72 | HR 78 | Temp 98.0°F | Ht 63.0 in | Wt 187.0 lb

## 2024-09-11 DIAGNOSIS — R300 Dysuria: Secondary | ICD-10-CM | POA: Diagnosis not present

## 2024-09-11 DIAGNOSIS — Z803 Family history of malignant neoplasm of breast: Secondary | ICD-10-CM | POA: Diagnosis not present

## 2024-09-11 DIAGNOSIS — N951 Menopausal and female climacteric states: Secondary | ICD-10-CM | POA: Insufficient documentation

## 2024-09-11 DIAGNOSIS — Z01419 Encounter for gynecological examination (general) (routine) without abnormal findings: Secondary | ICD-10-CM

## 2024-09-11 DIAGNOSIS — Z1331 Encounter for screening for depression: Secondary | ICD-10-CM | POA: Diagnosis not present

## 2024-09-11 DIAGNOSIS — Z90710 Acquired absence of both cervix and uterus: Secondary | ICD-10-CM | POA: Insufficient documentation

## 2024-09-11 NOTE — Assessment & Plan Note (Addendum)
 Neg genetic testing 2024, +VUS Continue annual mammogram

## 2024-09-11 NOTE — Assessment & Plan Note (Signed)
 Anticipatory guidance provided. Lifestyle modifications for VMS were reviewed, including eliminating caffeine and stress reduction techniques like mindfulness. Recommend regular strength training, improve sleep, and decreasing alcohol intake. Reviewed hormonal testing is not required for treatment of symptoms. To follow-up for symptoms that are affecting her quality of life.

## 2024-09-11 NOTE — Assessment & Plan Note (Addendum)
 Cervical cancer screening performed according to ASCCP guidelines. Encouraged annual mammogram screening Colonoscopy UTD Labs and immunizations with her primary Encouraged healthy lifestyle practices with diet and exercise For patients under 48yo, I recommend 1000mg  calcium daily and 600IU of vitamin D  daily.

## 2024-09-11 NOTE — Progress Notes (Signed)
 48 y.o. G58P2002 female s/p hysterectomy (age 62 for AUB-F), f family history of breast cancer  here for annual exam. Married. 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  No LMP recorded. Patient has had a hysterectomy.   Started having VMS earlier this year. Improved with estroven. Pt reports slight burning when she urinates.  Chronic insomnia, working with PCP. Has tried medications in the past however they made her groggy.  She is planning to start melatonin.  Abnormal bleeding: none Pelvic discharge or pain: none Breast mass, nipple discharge or skin changes : none Last PAP:     Component Value Date/Time   DIAGPAP  07/20/2022 1641    - Negative for intraepithelial lesion or malignancy (NILM)   ADEQPAP  07/20/2022 1641    Satisfactory for evaluation; transformation zone component ABSENT.   Last mammogram: : 08/25/24 density B, birads 1 neg Last colonoscopy: 11/04/23 39yr recall  Exercise: walks occasionally Tob use: never  [employer] Visit from 09/11/2024 in [facility]  PHQ-2 Total Score 0    GYN HISTORY: S/p hysterectomy (age 4 for AUB-F)  Right breast biopsy, 2019- benign  OB History  Gravida Para Term Preterm AB Living  2 2 2   2   SAB IAB Ectopic Multiple Live Births          # Outcome Date GA Lbr Len/2nd Weight Sex Type Anes PTL Lv  2 Term           1 Term             Past Medical History:  Diagnosis Date   Abnormal Pap smear of cervix    many yrs ago, just repeat done   ADHD (attention deficit hyperactivity disorder), inattentive type    Asthma    Back pain    Bilateral swelling of feet    Constipation    Dysmenorrhea    Fibroid    Headache    Heartburn    Joint pain    Menorrhagia    Palpitations    Sickle cell trait    SOB (shortness of breath)    Vitamin B12 deficiency    Vitamin D  deficiency     Past Surgical History:  Procedure Laterality Date   ABDOMINAL HYSTERECTOMY  2007   fibroid,  heavy periods   BREAST BIOPSY Right 10/2018   x2   CESAREAN SECTION     COLONOSCOPY  11/04/2023   [facility] at [facility]    Current Outpatient Medications on File Prior to Visit  Medication Sig Dispense Refill   albuterol  (VENTOLIN  HFA) 108 (90 Base) MCG/ACT inhaler Inhale 2 puffs into the lungs every 6 (six) hours as needed for wheezing or shortness of breath. 18 g 6   Cholecalciferol (VITAMIN D3) 50 MCG (2000 UT) capsule Take 1 capsule (2,000 Units total) by mouth daily.     fluticasone  (FLONASE ) 50 MCG/ACT nasal spray Place 2 sprays into the nose.     ibuprofen  (ADVIL ) 800 MG tablet Take 1 tablet (800 mg total) by mouth every 8 (eight) hours as needed for fever or headache. 45 tablet 3   No current facility-administered medications on file prior to visit.    Social History   Socioeconomic History   Marital status: Married    Spouse name: Not on file   Number of children: Not on file   Years of education: Not on file   Highest education level: Bachelor's degree (e.g., BA, AB, BS)  Occupational  History   Occupation: Astronomer  Tobacco Use   Smoking status: Never   Smokeless tobacco: Never  Vaping Use   Vaping status: Never Used  Substance and Sexual Activity   Alcohol use: No   Drug use: No   Sexual activity: Yes    Partners: Male    Birth control/protection: Surgical    Comment: hysterectomy-1st intercourse 48 yo-Fewer than 5 partners  Other Topics Concern   Not on file  Social History Narrative   Not on file   Social Drivers of Health   Financial Resource Strain: Low Risk  (12/23/2021)   Overall Financial Resource Strain (CARDIA)    Difficulty of Paying Living Expenses: Not hard at all  Food Insecurity: Low Risk  (08/22/2024)   Received from [facility]   Hunger Vital Sign    Within the past 12 months, you worried that your food would run out before you got money to buy more: Never true    Within the past 12 months, the food you bought just  didn't last and you didn't have money to get more. : Never true  Transportation Needs: No Transportation Needs (08/22/2024)   Received from [facility]    In the past 12 months, has lack of reliable transportation kept you from medical appointments, meetings, work or from getting things needed for daily living? : No  Physical Activity: Unknown (12/23/2021)   Exercise Vital Sign    Days of Exercise per Week: 0 days    Minutes of Exercise per Session: Not on file  Stress: No Stress Concern Present (12/23/2021)   [employer] of Occupational Health - Occupational Stress Questionnaire    Feeling of Stress : Not at all  Social Connections: Unknown (03/30/2022)   Received from [facility]   Social Network    Social Network: Not on file  Intimate Partner Violence: Unknown (03/05/2022)   Received from [facility]   HITS    Physically Hurt: Not on file    Insult or Talk Down To: Not on file    Threaten Physical Harm: Not on file    Scream or Curse: Not on file    Family History  Problem Relation Age of Onset   Multiple myeloma Mother 32   Hyperlipidemia Father    Hypertension Father    Stroke Father    Heart attack Father    Heart disease Father    Sudden death Father    Breast cancer Maternal Aunt        half-aunt, dx. >50   Breast cancer Maternal Aunt    Ovarian cancer Maternal Aunt        half-aunt, dx. >50   Lung cancer Maternal Aunt        dx. >50   Lung cancer Maternal Uncle        dx. >50   Prostate cancer Maternal Grandfather    Lung cancer Brother    Colon cancer Neg Hx    Colon polyps Neg Hx    Esophageal cancer Neg Hx    Stomach cancer Neg Hx    Rectal cancer Neg Hx     Allergies  Allergen Reactions   Citrus Hives      PE Today's Vitals   09/11/24 0819  BP: 112/72  Pulse: 78  Temp: 98 F (36.7 C)  TempSrc: Oral  SpO2: 99%  Weight: 187 lb (84.8 kg)  Height: 5' 3 (1.6 m)   Body mass index is 33.13  kg/m.  Physical  Exam Vitals reviewed. Exam conducted with a chaperone present.  Constitutional:      General: She is not in acute distress.    Appearance: Normal appearance.  HENT:     Head: Normocephalic and atraumatic.     Nose: Nose normal.  Eyes:     Extraocular Movements: Extraocular movements intact.     Conjunctiva/sclera: Conjunctivae normal.  Neck:     Thyroid : No thyroid  mass, thyromegaly or thyroid  tenderness.  Pulmonary:     Effort: Pulmonary effort is normal.  Chest:     Chest wall: No mass or tenderness.  Breasts:    Right: Normal. No swelling, mass, nipple discharge or tenderness.     Left: Normal. No swelling, mass, nipple discharge or tenderness.  Abdominal:     General: There is no distension.     Palpations: Abdomen is soft.     Tenderness: There is no abdominal tenderness.  Genitourinary:    General: Normal vulva.     Exam position: Lithotomy position.     Urethra: No prolapse.     Vagina: Normal. No vaginal discharge or bleeding.     Cervix: No lesion.     Adnexa: Right adnexa normal and left adnexa normal.     Comments: Cervix and uterus absent Musculoskeletal:        General: Normal range of motion.     Cervical back: Normal range of motion.  Lymphadenopathy:     Upper Body:     Right upper body: No axillary adenopathy.     Left upper body: No axillary adenopathy.     Lower Body: No right inguinal adenopathy. No left inguinal adenopathy.  Skin:    General: Skin is warm and dry.  Neurological:     General: No focal deficit present.     Mental Status: She is alert.  Psychiatric:        Mood and Affect: Mood normal.        Behavior: Behavior normal.      Assessment and Plan:        Well woman exam with routine gynecological exam Assessment & Plan: Cervical cancer screening performed according to ASCCP guidelines. Encouraged annual mammogram screening Colonoscopy UTD Labs and immunizations with her primary Encouraged healthy lifestyle practices with diet  and exercise For patients under 50yo, I recommend 1000mg  calcium daily and 600IU of vitamin D  daily.   Family history of breast cancer Assessment & Plan: Neg genetic testing 2024, +VUS Continue annual mammogram   H/O: hysterectomy  Negative depression screening  Dysuria -     Urinalysis,Complete w/RFL Culture Recommend f/u with urology for +1 blood on UA, seen last in 2023  Perimenopause Assessment & Plan: Anticipatory guidance provided. Lifestyle modifications for VMS were reviewed, including eliminating caffeine and stress reduction techniques like mindfulness. Recommend regular strength training, improve sleep, and decreasing alcohol intake. Reviewed hormonal testing is not required for treatment of symptoms. To follow-up for symptoms that are affecting her quality of life.  Vera LULLA Pa, MD

## 2024-09-11 NOTE — Patient Instructions (Signed)

## 2024-09-13 LAB — URINE CULTURE
MICRO NUMBER:: 17090717
Result:: NO GROWTH
SPECIMEN QUALITY:: ADEQUATE

## 2024-09-13 LAB — URINALYSIS, COMPLETE W/RFL CULTURE
Bilirubin Urine: NEGATIVE
Glucose, UA: NEGATIVE
Hyaline Cast: NONE SEEN /LPF
Leukocyte Esterase: NEGATIVE
Nitrites, Initial: NEGATIVE
Protein, ur: NEGATIVE
Specific Gravity, Urine: 1.015 (ref 1.001–1.035)
pH: 5 (ref 5.0–8.0)

## 2024-09-13 LAB — CULTURE INDICATED

## 2024-09-14 ENCOUNTER — Ambulatory Visit: Payer: Self-pay | Admitting: Obstetrics and Gynecology

## 2024-12-03 ENCOUNTER — Emergency Department (HOSPITAL_COMMUNITY)

## 2024-12-03 ENCOUNTER — Emergency Department (HOSPITAL_COMMUNITY)
Admission: EM | Admit: 2024-12-03 | Discharge: 2024-12-03 | Disposition: A | Discharge status: Home / Self Care | Attending: Emergency Medicine | Admitting: Emergency Medicine

## 2024-12-03 ENCOUNTER — Encounter (HOSPITAL_COMMUNITY): Payer: Self-pay | Admitting: Pharmacy Technician

## 2024-12-03 ENCOUNTER — Other Ambulatory Visit: Payer: Self-pay

## 2024-12-03 DIAGNOSIS — M79604 Pain in right leg: Secondary | ICD-10-CM | POA: Diagnosis present

## 2024-12-03 DIAGNOSIS — M79605 Pain in left leg: Secondary | ICD-10-CM | POA: Diagnosis not present

## 2024-12-03 LAB — BASIC METABOLIC PANEL WITH GFR
Anion gap: 9 (ref 5–15)
BUN: 12 mg/dL (ref 6–20)
CO2: 24 mmol/L (ref 22–32)
Calcium: 9.4 mg/dL (ref 8.9–10.3)
Chloride: 106 mmol/L (ref 98–111)
Creatinine, Ser: 0.69 mg/dL (ref 0.44–1.00)
GFR, Estimated: >60 mL/min
Glucose, Bld: 88 mg/dL (ref 70–99)
Potassium: 3.9 mmol/L (ref 3.5–5.1)
Sodium: 140 mmol/L (ref 135–145)

## 2024-12-03 LAB — CBC
HCT: 39.7 % (ref 36.0–46.0)
Hemoglobin: 13.1 g/dL (ref 12.0–15.0)
MCH: 27.4 pg (ref 26.0–34.0)
MCHC: 33 g/dL (ref 30.0–36.0)
MCV: 83.1 fL (ref 80.0–100.0)
Platelets: 245 K/uL (ref 150–400)
RBC: 4.78 MIL/uL (ref 3.87–5.11)
RDW: 13.5 % (ref 11.5–15.5)
WBC: 3.7 K/uL — ABNORMAL LOW (ref 4.0–10.5)
nRBC: 0 % (ref 0.0–0.2)

## 2024-12-03 LAB — T4, FREE: Free T4: 0.97 ng/dL (ref 0.80–2.00)

## 2024-12-03 LAB — MAGNESIUM: Magnesium: 1.9 mg/dL (ref 1.7–2.4)

## 2024-12-03 LAB — CK: Total CK: 70 U/L (ref 38–234)

## 2024-12-03 LAB — TSH: TSH: 0.837 u[IU]/mL (ref 0.350–4.500)

## 2024-12-03 NOTE — ED Triage Notes (Signed)
 Pt here POV with complaints of bil leg burning when things would touch bil legs. Yesterday pain started and swelling to bil feet. Reports tightness in bil legs. Endorses shob this morning. Pt speaking in complete sentences, NAD noted.

## 2024-12-03 NOTE — ED Provider Notes (Signed)
 " [place] EMERGENCY DEPARTMENT AT [facility] Provider Note   CSN: [id number] Arrival date & time: 12/03/24  1041     Patient presents with: No chief complaint on file.   Norma Soto is a 49 y.o. female.   Patient is a 49 year old female with no significant past medical history presenting to the emergency department with leg pain.  Patient states that yesterday she started to develop pain in her bilateral calfs.  She states that it feels like a burning type of pain that was uncomfortable when she had pants on.  She states that in the evening the pain became more severe but she was unable to describe this felt like.  She states that the pain gets worse when she is up and walking and is better at rest.  She denies any numbness or tingling.  She denies any associated back pain, trauma or falls.  She denies any dysuria or hematuria or urinary incontinence or retention.  She denies any rash or overlying skin changes.  Of note she states that she did have a brief episode of shortness of breath this morning that has since resolved.  The history is provided by the patient.       Prior to Admission medications  Medication Sig Start Date End Date Taking? Authorizing Provider  albuterol  (VENTOLIN  HFA) 108 (90 Base) MCG/ACT inhaler Inhale 2 puffs into the lungs every 6 (six) hours as needed for wheezing or shortness of breath. 07/12/19   Ines Onetha NOVAK, MD  Cholecalciferol (VITAMIN D3) 50 MCG (2000 UT) capsule Take 1 capsule (2,000 Units total) by mouth daily. 10/14/22   [place] Adelita PENNER, MD  fluticasone  (FLONASE ) 50 MCG/ACT nasal spray Place 2 sprays into the nose. 02/28/24 02/27/25  [provider]  ibuprofen  (ADVIL ) 800 MG tablet Take 1 tablet (800 mg total) by mouth every 8 (eight) hours as needed for fever or headache. 07/30/21   Ines Onetha NOVAK, MD    Allergies: Citrus    Review of Systems  Updated Vital Signs BP 132/89   Pulse 71   Temp 98.2 F (36.8 C)  (Oral)   Resp 16   Ht 5' 4 (1.626 m)   Wt 83.9 kg   SpO2 99%   BMI 31.76 kg/m   Physical Exam Vitals and nursing note reviewed.  Constitutional:      General: She is not in acute distress.    Appearance: Normal appearance.  HENT:     Head: Normocephalic and atraumatic.     Nose: Nose normal.     Mouth/Throat:     Mouth: Mucous membranes are moist.  Eyes:     Extraocular Movements: Extraocular movements intact.  Cardiovascular:     Rate and Rhythm: Normal rate and regular rhythm.     Pulses: Normal pulses.  Pulmonary:     Effort: Pulmonary effort is normal.     Breath sounds: Normal breath sounds.  Abdominal:     General: Abdomen is flat.     Palpations: Abdomen is soft.     Tenderness: There is no abdominal tenderness.  Musculoskeletal:        General: Normal range of motion.     Cervical back: Normal range of motion.     Right lower leg: No edema.     Left lower leg: No edema.     Comments: No midline back tenderness No bony tenderness of bilateral lower extremities Negative straight leg raise bilaterally  Skin:    General:  Skin is warm and dry.  Neurological:     General: No focal deficit present.     Mental Status: She is alert and oriented to person, place, and time.     Sensory: No sensory deficit.     Motor: No weakness.     Comments: Burning sensation to bilateral posterior bilateral calves, no overlying skin changes  Psychiatric:        Mood and Affect: Mood normal.        Behavior: Behavior normal.     (all labs ordered are listed, but only abnormal results are displayed) Labs Reviewed  CBC - Abnormal; Notable for the following components:      Result Value   WBC 3.7 (*)    All other components within normal limits  BASIC METABOLIC PANEL WITH GFR  MAGNESIUM  CK  TSH  T4, FREE    EKG: EKG Interpretation Date/Time:  Sunday December 03 2024 11:24:31 EST Ventricular Rate:  78 PR Interval:  131 QRS Duration:  80 QT Interval:  362 QTC  Calculation: 413 R Axis:   77  Text Interpretation: Sinus rhythm Borderline T abnormalities, anterior leads No significant change since last tracing Confirmed by Ellouise Fine ([id number]) on 12/03/2024 2:48:40 PM  Radiology: DG Chest 2 View Result Date: 12/03/2024 CLINICAL DATA:  Shortness of breath EXAM: CHEST - 2 VIEW COMPARISON:  July 11, 2019 FINDINGS: The heart size and mediastinal contours are within normal limits. Both lungs are clear. The visualized skeletal structures are unremarkable. IMPRESSION: No active cardiopulmonary disease. Electronically Signed   By: Lynwood Landy Raddle M.D.   On: 12/03/2024 11:22     Procedures   Medications Ordered in the ED - No data to display  Clinical Course as of 12/03/24 1825  Sun Dec 03, 2024  1701 TSH normalized here compared to outpatient labs. Unclear etiology of patients symptoms but recommended outpatient follow up for additional evaluation. She was given strict return precautions.  [VK]    Clinical Course User Index [VK] Kingsley, Kisa Fujii K, DO                                 Medical Decision Making This patient presents to the ED with chief complaint(s) of leg pain with no pertinent past medical history which further complicates the presenting complaint. The complaint involves an extensive differential diagnosis and also carries with it a high risk of complications and morbidity.    The differential diagnosis includes rhabdo, electrolyte derangement, neuropathy, ankle pulses bilaterally and no vascular history making claudication less likely.,  Thyroid  dysfunction, no back pain or reproducible radiculopathy on exam making a sciatica unlikely, no numbness or weakness making cauda equina or other spinal cord etiology less likely  Additional history obtained: Additional history obtained from N/A Records reviewed Care Everywhere/External Records  ED Course and Reassessment: On patient's arrival she is hemodynamically stable in no acute  distress.  She is neurovascularly intact with burning sensation of bilateral posterior lateral calves within the L5 distribution, though does not seem to affect the foot.  She has no back pain and a negative straight leg raise again radiculopathy less likely, possibly another peripheral neuropathy.  She had labs and will add on mag and CK as well as thyroid  studies and will be closely reassessed.  Independent labs interpretation:  The following labs were independently interpreted: Within normal range  Independent visualization of imaging:  - I independently visualized  the following imaging with scope of interpretation limited to determining acute life threatening conditions related to emergency care: Chest x-ray, which revealed no acute disease  Consultation: - Consulted or discussed management/test interpretation w/ external professional: N/A  Consideration for admission or further workup: Patient has no emergent conditions requiring admission or further work-up at this time and is stable for discharge home with primary care follow-up  Social Determinants of health: N/A    Amount and/or Complexity of Data Reviewed Labs: ordered. Radiology: ordered.       Final diagnoses:  Pain in both lower extremities    ED Discharge Orders     None          Ellouise Richerd POUR, DO 12/03/24 1825  "

## 2024-12-03 NOTE — Discharge Instructions (Addendum)
 You were seen in the emergency department for your leg pain.  Your workup showed no abnormalities with your electrolytes, thyroid  function or muscle enzyme.  He had no signs of blood clot on your exam and have good blood flow to your legs.  It is unclear what is causing your symptoms at this time but may be related to nerve type of pain.  You should follow-up with your primary doctor to have your symptoms rechecked and for further evaluation.  You should return to the emergency department if you develop weakness in your legs and unable to walk, you are unable to urinate, you have severe back pain or any other new or concerning symptoms.

## 2025-09-12 ENCOUNTER — Ambulatory Visit: Payer: PRIVATE HEALTH INSURANCE | Admitting: Obstetrics and Gynecology
